# Patient Record
Sex: Female | Born: 1965
Health system: Southern US, Community
[De-identification: ages and names within clinical notes are randomized; demographics above are authoritative.]

## PROBLEM LIST (undated history)

## (undated) DIAGNOSIS — N6019 Diffuse cystic mastopathy of unspecified breast: Secondary | ICD-10-CM

## (undated) HISTORY — PX: ABLATION: SHX5711

## (undated) HISTORY — PX: BREAST BIOPSY: SHX20

## (undated) HISTORY — PX: TUBAL LIGATION: SHX77

---

## 2016-02-03 ENCOUNTER — Ambulatory Visit: Admit: 2016-02-03 | Payer: Self-pay

## 2016-02-03 ENCOUNTER — Ambulatory Visit: Admit: 2016-02-03 | Payer: Self-pay | Admitting: Ophthalmology

## 2016-02-03 SURGERY — BLEPHAROPLASTY
Anesthesia: IV Sedation (MBSC Only) | Laterality: Bilateral

## 2017-06-17 ENCOUNTER — Ambulatory Visit (INDEPENDENT_AMBULATORY_CARE_PROVIDER_SITE_OTHER): Payer: Managed Care, Other (non HMO) | Admitting: Family Medicine

## 2017-06-17 ENCOUNTER — Encounter: Payer: Self-pay | Admitting: Family Medicine

## 2017-06-17 VITALS — BP 134/82 | HR 100 | Temp 97.9°F | Ht 63.0 in | Wt 151.2 lb

## 2017-06-17 DIAGNOSIS — R82998 Other abnormal findings in urine: Secondary | ICD-10-CM

## 2017-06-17 DIAGNOSIS — M8580 Other specified disorders of bone density and structure, unspecified site: Secondary | ICD-10-CM

## 2017-06-17 DIAGNOSIS — N200 Calculus of kidney: Secondary | ICD-10-CM | POA: Diagnosis not present

## 2017-06-17 DIAGNOSIS — Z0000 Encounter for general adult medical examination without abnormal findings: Secondary | ICD-10-CM

## 2017-06-17 LAB — POCT URINALYSIS DIPSTICK
Bilirubin, UA: NEGATIVE
Glucose, UA: NEGATIVE
Ketones, UA: NEGATIVE
Nitrite, UA: POSITIVE
ODOR: ABNORMAL
PH UA: 6 (ref 5.0–8.0)
PROTEIN UA: NEGATIVE
Spec Grav, UA: 1.02 (ref 1.010–1.025)
UROBILINOGEN UA: 0.2 U/dL

## 2017-06-17 MED ORDER — NITROFURANTOIN MONOHYD MACRO 100 MG PO CAPS: 100.0000 mg | ORAL_CAPSULE | Freq: Two times a day (BID) | ORAL | 0 refills | Status: DC

## 2017-06-17 NOTE — Patient Instructions (Addendum)
We'll get labs today I do recommend yearly flu shots; for individuals who don't want flu shots, try to practice excellent hand hygiene, and avoid nursing homes, day cares, and hospitals during peak flu season; taking additional vitamin C daily during flu/cold season may help boost your immune system too Return in the next several weeks for a complete physical with pap smear   Kidney Stones Kidney stones (urolithiasis) are rock-like masses that form inside of the kidneys. Kidneys are organs that make pee (urine). A kidney stone can cause very bad pain and can block the flow of pee. The stone usually leaves your body (passes) through your pee. You may need to have a doctor take out the stone. Follow these instructions at home: Eating and drinking  Drink enough fluid to keep your pee clear or pale yellow. This will help you pass the stone.  If told by your doctor, change the foods you eat (your diet). This may include: ? Limiting how much salt (sodium) you eat. ? Eating more fruits and vegetables. ? Limiting how much meat, poultry, fish, and eggs you eat.  Follow instructions from your doctor about eating or drinking restrictions. General instructions  Collect pee samples as told by your doctor. You may need to collect a pee sample: ? 24 hours after a stone comes out. ? 8-12 weeks after a stone comes out, and every 6-12 months after that.  Strain your pee every time you pee (urinate), for as long as told. Use the strainer that your doctor recommends.  Do not throw out the stone. Keep it so that it can be tested by your doctor.  Take over-the-counter and prescription medicines only as told by your doctor.  Keep all follow-up visits as told by your doctor. This is important. You may need follow-up tests. Preventing kidney stones To prevent another kidney stone:  Drink enough fluid to keep your pee clear or pale yellow. This is the best way to prevent kidney stones.  Eat healthy  foods.  Avoid certain foods as told by your doctor. You may be told to eat less protein.  Stay at a healthy weight.  Contact a doctor if:  You have pain that gets worse or does not get better with medicine. Get help right away if:  You have a fever or chills.  You get very bad pain.  You get new pain in your belly (abdomen).  You pass out (faint).  You cannot pee. This information is not intended to replace advice given to you by your health care provider. Make sure you discuss any questions you have with your health care provider. Document Released: 10/20/2007 Document Revised: 01/20/2016 Document Reviewed: 01/20/2016 Elsevier Interactive Patient Education  2017 [employer].

## 2017-06-17 NOTE — Assessment & Plan Note (Signed)
May be related to body size, but will check vit D

## 2017-06-17 NOTE — Progress Notes (Signed)
BP 134/82 (BP Location: Right Arm, Patient Position: Sitting, Cuff Size: Normal)   Pulse 100   Temp 97.9 F (36.6 C) (Oral)   Ht 5\' 3"  (1.6 m)   Wt 151 lb 3.2 oz (68.6 kg)   SpO2 97%   BMI 26.78 kg/m    Subjective:    Patient ID: Lori Gray, female    DOB: 01-20-1966, 52 y.o.   MRN: [id number]  HPI: Lori Gray is a 52 y.o. female  Chief Complaint  Patient presents with  . 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, mostly sitting at work;  She gained weight after losing her father last April; 10 pounds; he lived to be 70; unusual stroke in the back of the brain; he was not a smoker Mother was a smoker; had RA; patient does not want any testing; does not want any more testing and has no sx  She would like to check her cholesterol; had a lot of coffee and donut (4+ hours ago) [facility]; her bone density looks low on the scans  Tetanus UTD; does not want flu shots  She had a kidney stone, passed it on the 19th of last month; same symptoms that her daughter had; treating with lemon H2O; maybe uncle (maternal) had them too; would like her urine checked today  Depression screen [facility] 2/9 06/17/2017  Decreased Interest 0  Down, Depressed, Hopeless 0  PHQ - 2 Score 0    Relevant past medical, surgical, family and social history reviewed History reviewed. No pertinent past medical history. Past Surgical History:  Procedure Laterality Date  . ABLATION    . TUBAL LIGATION     Family History  Problem Relation Age of Onset  . COPD Mother   . Cancer Mother        lung  . Depression Mother   . Arthritis Mother        rheumatoid  . Arthritis Father   . Cancer Father        unknown  . Diabetes Father   . Heart disease Father        CABG x 4  . Hyperlipidemia Father   . Hypertension Father   . Kidney disease Father   . Stroke Father 27  . Vision loss Father   . Macular degeneration Father          dry  . Cancer Brother    Social History   Tobacco Use  . Smoking status: Never Smoker  . Smokeless tobacco: Never Used  Substance Use Topics  . Alcohol use: Yes    Alcohol/week: 3.0 oz    Types: 5 Glasses of wine per week  . Drug use: No    Interim medical history since last visit reviewed. Allergies and medications reviewed  Review of Systems Per HPI unless specifically indicated above     Objective:    BP 134/82 (BP Location: Right Arm, Patient Position: Sitting, Cuff Size: Normal)   Pulse 100   Temp 97.9 F (36.6 C) (Oral)   Ht 5\' 3"  (1.6 m)   Wt 151 lb 3.2 oz (68.6 kg)   SpO2 97%   BMI 26.78 kg/m   Wt Readings from Last 3 Encounters:  06/17/17 151 lb 3.2 oz (68.6 kg)    Physical Exam  Constitutional: She appears well-developed and well-nourished. No distress.  Eyes: No scleral icterus.  Cardiovascular: Normal rate and regular rhythm.  Pulmonary/Chest: Effort normal and breath sounds normal.  Neurological: She is alert.  Psychiatric: She has a normal mood and affect.    Results for orders placed or performed in visit on 06/17/17  POCT Urinalysis Dipstick  Result Value Ref Range   Color, UA dark yellow    Clarity, UA cloudy    Glucose, UA neg    Bilirubin, UA neg    Ketones, UA neg    Spec Grav, UA 1.020 1.010 - 1.025   Blood, UA trace    pH, UA 6.0 5.0 - 8.0   Protein, UA neg    Urobilinogen, UA 0.2 0.2 or 1.0 E.U./dL   Nitrite, UA POSITIVE    Leukocytes, UA Large (3+) (A) Negative   Appearance cloudy    Odor abnormal       Assessment & Plan:   Problem List Items Addressed This Visit      Musculoskeletal and Integument   Osteopenia - Primary    May be related to body size, but will check vit D      Relevant Orders   VITAMIN D 25 Hydroxy (Vit-D Deficiency, Fractures)    Other Visit Diagnoses    Preventative health care       was getting paps at [facility], but needs that soon; will get labs and bring her back for physical   Relevant  Orders   CBC with Differential/Platelet   COMPLETE METABOLIC PANEL WITH GFR   Lipid panel   TSH   Kidney stone on right side       check urine; acidify urine with lemons (already doing it), hydrate   Relevant Orders   POCT Urinalysis Dipstick (Completed)   Leukocytes in urine       Relevant Orders   Urine Culture       Follow up plan: Return in about 4 weeks (around 07/15/2017) for complete physical.  An after-visit summary was printed and given to the patient at [facility].  Please see the patient instructions which may contain other information and recommendations beyond what is mentioned above in the assessment and plan.  Meds ordered this encounter  Medications  . nitrofurantoin, macrocrystal-monohydrate, (MACROBID) 100 MG capsule    Sig: Take 1 capsule (100 mg total) by mouth 2 (two) times daily.    Dispense:  10 capsule    Refill:  0    Orders Placed This Encounter  Procedures  . Urine Culture  . CBC with Differential/Platelet  . COMPLETE METABOLIC PANEL WITH GFR  . Lipid panel  . TSH  . VITAMIN D 25 Hydroxy (Vit-D Deficiency, Fractures)  . POCT Urinalysis Dipstick

## 2017-06-18 LAB — COMPLETE METABOLIC PANEL WITH GFR
AG Ratio: 1.5 (calc) (ref 1.0–2.5)
ALKALINE PHOSPHATASE (APISO): 70 U/L (ref 33–115)
ALT: 13 U/L (ref 6–29)
AST: 15 U/L (ref 10–35)
Albumin: 4.3 g/dL (ref 3.6–5.1)
BILIRUBIN TOTAL: 0.5 mg/dL (ref 0.2–1.2)
BUN: 10 mg/dL (ref 7–25)
CHLORIDE: 104 mmol/L (ref 98–110)
CO2: 26 mmol/L (ref 20–32)
Calcium: 9 mg/dL (ref 8.6–10.2)
Creat: 0.57 mg/dL (ref 0.50–1.10)
GFR, Est African American: 126 mL/min/{1.73_m2} (ref >=60)
GFR, Est Non African American: 109 mL/min/{1.73_m2} (ref >=60)
GLUCOSE: 84 mg/dL (ref 65–139)
Globulin: 2.8 g/dL (calc) (ref 1.9–3.7)
Potassium: 4 mmol/L (ref 3.5–5.3)
SODIUM: 139 mmol/L (ref 135–146)
Total Protein: 7.1 g/dL (ref 6.1–8.1)

## 2017-06-18 LAB — LIPID PANEL
CHOLESTEROL: 213 mg/dL — AB (ref <200)
HDL: 79 mg/dL (ref >50)
LDL CHOLESTEROL (CALC): 117 mg/dL — AB
Non-HDL Cholesterol (Calc): 134 mg/dL (calc) — ABNORMAL HIGH (ref <130)
Total CHOL/HDL Ratio: 2.7 (calc) (ref <5.0)
Triglycerides: 77 mg/dL (ref <150)

## 2017-06-18 LAB — CBC WITH DIFFERENTIAL/PLATELET
BASOS ABS: 77 {cells}/uL (ref 0–200)
Basophils Relative: 0.9 %
EOS PCT: 0.9 %
Eosinophils Absolute: 77 cells/uL (ref 15–500)
HEMATOCRIT: 39.9 % (ref 35.0–45.0)
Hemoglobin: 14.1 g/dL (ref 11.7–15.5)
LYMPHS ABS: 2554 {cells}/uL (ref 850–3900)
MCH: 30.7 pg (ref 27.0–33.0)
MCHC: 35.3 g/dL (ref 32.0–36.0)
MCV: 86.7 fL (ref 80.0–100.0)
MONOS PCT: 9.1 %
MPV: 10.7 fL (ref 7.5–12.5)
Neutro Abs: 5108 cells/uL (ref 1500–7800)
Neutrophils Relative %: 59.4 %
Platelets: 328 10*3/uL (ref 140–400)
RBC: 4.6 10*6/uL (ref 3.80–5.10)
RDW: 12.5 % (ref 11.0–15.0)
Total Lymphocyte: 29.7 %
WBC mixed population: 783 cells/uL (ref 200–950)
WBC: 8.6 10*3/uL (ref 3.8–10.8)

## 2017-06-18 LAB — VITAMIN D 25 HYDROXY (VIT D DEFICIENCY, FRACTURES): VIT D 25 HYDROXY: 19 ng/mL — AB (ref 30–100)

## 2017-06-18 LAB — TSH: TSH: 1.6 mIU/L

## 2017-06-20 ENCOUNTER — Other Ambulatory Visit: Payer: Self-pay | Admitting: Family Medicine

## 2017-06-20 ENCOUNTER — Telehealth: Payer: Self-pay

## 2017-06-20 LAB — URINE CULTURE
MICRO NUMBER:: 90140881
SPECIMEN QUALITY: ADEQUATE

## 2017-06-20 MED ORDER — VITAMIN D (ERGOCALCIFEROL) 1.25 MG (50000 UNIT) PO CAPS: 50000.0000 [IU] | ORAL_CAPSULE | ORAL | 0 refills | Status: AC

## 2017-06-20 NOTE — Progress Notes (Signed)
Add vit D weekly x 4 weeks, then 1000 iu daily

## 2017-06-20 NOTE — Telephone Encounter (Signed)
-----   Message from Arnetha Courser, MD sent at 06/20/2017  9:42 AM EST ----- Please let the patient know that the antibiotic should do the trick for this urinary tract infection; if any persistent symptoms after completion of therapy, come by for CMA visit to retest urine (POCT Urine dip) Her CBC is normal; CMP is normal; LDL higher than ideal, so try to limit fatty meats, saturated fats, cheese, egg yolks, and get more whole grains and fibers Her vitamin D is quite low, so I'll send in Rx for vit D once a week for 4 weeks, then just take 1,000 iu daily; thank you

## 2017-06-20 NOTE — Telephone Encounter (Signed)
Called pt, no answer. LM for pt informing her of information below per Dr.Lada. Also sent email to sign up for mychart. CRM created.

## 2017-06-21 ENCOUNTER — Telehealth: Payer: Self-pay | Admitting: Family Medicine

## 2017-06-21 NOTE — Telephone Encounter (Signed)
Copied from [place] [phone]. Topic: Quick Communication - Rx Refill/Question >> Jun 21, 2017  1:13 PM Scherrie Gerlach wrote: Medication: nitrofurantoin, macrocrystal-monohydrate, (MACROBID) 100 MG capsule  and Vitamin D, Ergocalciferol, (DRISDOL) 50000 units CAPS capsule  Pt reports having had a kidney stone 1/02 and 1/19 Therefore wants to know if she should wait to start the Vit D until after she finishes the macrobid?  Pt states one of the side effects of Vit D is kidney stones Please advise

## 2017-06-21 NOTE — Telephone Encounter (Signed)
That's fine

## 2017-06-21 NOTE — Telephone Encounter (Signed)
Called pt informed her that it was ok to hold off on vit d until she finishes Macrobid.

## 2017-06-22 ENCOUNTER — Encounter: Payer: Self-pay | Admitting: Family Medicine

## 2017-07-05 ENCOUNTER — Ambulatory Visit (INDEPENDENT_AMBULATORY_CARE_PROVIDER_SITE_OTHER): Payer: Managed Care, Other (non HMO) | Admitting: Family Medicine

## 2017-07-05 ENCOUNTER — Encounter: Payer: Self-pay | Admitting: Family Medicine

## 2017-07-05 VITALS — BP 108/64 | HR 76 | Temp 98.0°F | Ht 63.0 in | Wt 145.8 lb

## 2017-07-05 DIAGNOSIS — Z124 Encounter for screening for malignant neoplasm of cervix: Secondary | ICD-10-CM

## 2017-07-05 DIAGNOSIS — Z87440 Personal history of urinary (tract) infections: Secondary | ICD-10-CM

## 2017-07-05 DIAGNOSIS — Z0000 Encounter for general adult medical examination without abnormal findings: Secondary | ICD-10-CM | POA: Diagnosis not present

## 2017-07-05 DIAGNOSIS — Z1239 Encounter for other screening for malignant neoplasm of breast: Secondary | ICD-10-CM

## 2017-07-05 DIAGNOSIS — Z1231 Encounter for screening mammogram for malignant neoplasm of breast: Secondary | ICD-10-CM

## 2017-07-05 LAB — POCT URINALYSIS DIPSTICK
Appearance: NORMAL
Bilirubin, UA: NEGATIVE
Blood, UA: NEGATIVE
Glucose, UA: NEGATIVE
Ketones, UA: NEGATIVE
LEUKOCYTES UA: NEGATIVE
NITRITE UA: NEGATIVE
PROTEIN UA: NEGATIVE
Spec Grav, UA: 1.01 (ref 1.010–1.025)
Urobilinogen, UA: 0.2 E.U./dL
pH, UA: 7 (ref 5.0–8.0)

## 2017-07-05 NOTE — Patient Instructions (Addendum)
Consider getting the new shingles vaccine called Shingrix; that is available for individuals 50 years of age and older, and is recommended even if you have had shingles in the past and/or already received the old shingles vaccine (Zostavax); it is a two-part series, and is available at many local pharmacies  Health Maintenance, Female Adopting a healthy lifestyle and getting preventive care can go a long way to promote health and wellness. Talk with your health care provider about what schedule of regular examinations is right for you. This is a good chance for you to check in with your provider about disease prevention and staying healthy. In between checkups, there are plenty of things you can do on your own. Experts have done a lot of research about which lifestyle changes and preventive measures are most likely to keep you healthy. Ask your health care provider for more information. Weight and diet Eat a healthy diet  Be sure to include plenty of vegetables, fruits, low-fat dairy products, and lean protein.  Do not eat a lot of foods high in solid fats, added sugars, or salt.  Get regular exercise. This is one of the most important things you can do for your health. ? Most adults should exercise for at least 150 minutes each week. The exercise should increase your heart rate and make you sweat (moderate-intensity exercise). ? Most adults should also do strengthening exercises at least twice a week. This is in addition to the moderate-intensity exercise.  Maintain a healthy weight  Body mass index (BMI) is a measurement that can be used to identify possible weight problems. It estimates body fat based on height and weight. Your health care provider can help determine your BMI and help you achieve or maintain a healthy weight.  For females 76 years of age and older: ? A BMI below 18.5 is considered underweight. ? A BMI of 18.5 to 24.9 is normal. ? A BMI of 25 to 29.9 is considered  overweight. ? A BMI of 30 and above is considered obese.  Watch levels of cholesterol and blood lipids  You should start having your blood tested for lipids and cholesterol at 50 years of age, then have this test every 5 years.  You may need to have your cholesterol levels checked more often if: ? Your lipid or cholesterol levels are high. ? You are older than 50 years of age. ? You are at high risk for heart disease.  Cancer screening Lung Cancer  Lung cancer screening is recommended for adults 11-67 years old who are at high risk for lung cancer because of a history of smoking.  A yearly low-dose CT scan of the lungs is recommended for people who: ? Currently smoke. ? Have quit within the past 15 years. ? Have at least a 30-pack-year history of smoking. A pack year is smoking an average of one pack of cigarettes a day for 1 year.  Yearly screening should continue until it has been 15 years since you quit.  Yearly screening should stop if you develop a health problem that would prevent you from having lung cancer treatment.  Breast Cancer  Practice breast self-awareness. This means understanding how your breasts normally appear and feel.  It also means doing regular breast self-exams. Let your health care provider know about any changes, no matter how small.  If you are in your 20s or 30s, you should have a clinical breast exam (CBE) by a health care provider every 1-3 years as part  of a regular health exam.  If you are 30 or older, have a CBE every year. Also consider having a breast X-ray (mammogram) every year.  If you have a family history of breast cancer, talk to your health care provider about genetic screening.  If you are at high risk for breast cancer, talk to your health care provider about having an MRI and a mammogram every year.  Breast cancer gene (BRCA) assessment is recommended for women who have family members with BRCA-related cancers. BRCA-related cancers  include: ? Breast. ? Ovarian. ? Tubal. ? Peritoneal cancers.  Results of the assessment will determine the need for genetic counseling and BRCA1 and BRCA2 testing.  Cervical Cancer Your health care provider may recommend that you be screened regularly for cancer of the pelvic organs (ovaries, uterus, and vagina). This screening involves a pelvic examination, including checking for microscopic changes to the surface of your cervix (Pap test). You may be encouraged to have this screening done every 3 years, beginning at age 70.  For women ages 17-65, health care providers may recommend pelvic exams and Pap testing every 3 years, or they may recommend the Pap and pelvic exam, combined with testing for human papilloma virus (HPV), every 5 years. Some types of HPV increase your risk of cervical cancer. Testing for HPV may also be done on women of any age with unclear Pap test results.  Other health care providers may not recommend any screening for nonpregnant women who are considered low risk for pelvic cancer and who do not have symptoms. Ask your health care provider if a screening pelvic exam is right for you.  If you have had past treatment for cervical cancer or a condition that could lead to cancer, you need Pap tests and screening for cancer for at least 20 years after your treatment. If Pap tests have been discontinued, your risk factors (such as having a new sexual partner) need to be reassessed to determine if screening should resume. Some women have medical problems that increase the chance of getting cervical cancer. In these cases, your health care provider may recommend more frequent screening and Pap tests.  Colorectal Cancer  This type of cancer can be detected and often prevented.  Routine colorectal cancer screening usually begins at 50 years of age and continues through 50 years of age.  Your health care provider may recommend screening at an earlier age if you have risk factors  for colon cancer.  Your health care provider may also recommend using home test kits to check for hidden blood in the stool.  A small camera at the end of a tube can be used to examine your colon directly (sigmoidoscopy or colonoscopy). This is done to check for the earliest forms of colorectal cancer.  Routine screening usually begins at age 85.  Direct examination of the colon should be repeated every 5-10 years through 50 years of age. However, you may need to be screened more often if early forms of precancerous polyps or small growths are found.  Skin Cancer  Check your skin from head to toe regularly.  Tell your health care provider about any new moles or changes in moles, especially if there is a change in a mole's shape or color.  Also tell your health care provider if you have a mole that is larger than the size of a pencil eraser.  Always use sunscreen. Apply sunscreen liberally and repeatedly throughout the day.  Protect yourself by wearing long  sleeves, pants, a wide-brimmed hat, and sunglasses whenever you are outside.  Heart disease, diabetes, and high blood pressure  High blood pressure causes heart disease and increases the risk of stroke. High blood pressure is more likely to develop in: ? People who have blood pressure in the high end of the normal range (130-139/85-89 mm Hg). ? People who are overweight or obese. ? People who are African American.  If you are 93-79 years of age, have your blood pressure checked every 3-5 years. If you are 85 years of age or older, have your blood pressure checked every year. You should have your blood pressure measured twice-once when you are at a hospital or clinic, and once when you are not at a hospital or clinic. Record the average of the two measurements. To check your blood pressure when you are not at a hospital or clinic, you can use: ? An automated blood pressure machine at a pharmacy. ? A home blood pressure monitor.  If  you are between 74 years and 26 years old, ask your health care provider if you should take aspirin to prevent strokes.  Have regular diabetes screenings. This involves taking a blood sample to check your fasting blood sugar level. ? If you are at a normal weight and have a low risk for diabetes, have this test once every three years after 50 years of age. ? If you are overweight and have a high risk for diabetes, consider being tested at a younger age or more often. Preventing infection Hepatitis B  If you have a higher risk for hepatitis B, you should be screened for this virus. You are considered at high risk for hepatitis B if: ? You were born in a country where hepatitis B is common. Ask your health care provider which countries are considered high risk. ? Your parents were born in a high-risk country, and you have not been immunized against hepatitis B (hepatitis B vaccine). ? You have HIV or AIDS. ? You use needles to inject street drugs. ? You live with someone who has hepatitis B. ? You have had sex with someone who has hepatitis B. ? You get hemodialysis treatment. ? You take certain medicines for conditions, including cancer, organ transplantation, and autoimmune conditions.  Hepatitis C  Blood testing is recommended for: ? Everyone born from [birth year] through 1963. ? Anyone with known risk factors for hepatitis C.  Sexually transmitted infections (STIs)  You should be screened for sexually transmitted infections (STIs) including gonorrhea and chlamydia if: ? You are sexually active and are younger than 50 years of age. ? You are older than 50 years of age and your health care provider tells you that you are at risk for this type of infection. ? Your sexual activity has changed since you were last screened and you are at an increased risk for chlamydia or gonorrhea. Ask your health care provider if you are at risk.  If you do not have HIV, but are at risk, it may be recommended  that you take a prescription medicine daily to prevent HIV infection. This is called pre-exposure prophylaxis (PrEP). You are considered at risk if: ? You are sexually active and do not regularly use condoms or know the HIV status of your partner(s). ? You take drugs by injection. ? You are sexually active with a partner who has HIV.  Talk with your health care provider about whether you are at high risk of being infected with HIV. If  you choose to begin PrEP, you should first be tested for HIV. You should then be tested every 3 months for as long as you are taking PrEP. Pregnancy  If you are premenopausal and you may become pregnant, ask your health care provider about preconception counseling.  If you may become pregnant, take 400 to 800 micrograms (mcg) of folic acid every day.  If you want to prevent pregnancy, talk to your health care provider about birth control (contraception). Osteoporosis and menopause  Osteoporosis is a disease in which the bones lose minerals and strength with aging. This can result in serious bone fractures. Your risk for osteoporosis can be identified using a bone density scan.  If you are 51 years of age or older, or if you are at risk for osteoporosis and fractures, ask your health care provider if you should be screened.  Ask your health care provider whether you should take a calcium or vitamin D supplement to lower your risk for osteoporosis.  Menopause may have certain physical symptoms and risks.  Hormone replacement therapy may reduce some of these symptoms and risks. Talk to your health care provider about whether hormone replacement therapy is right for you. Follow these instructions at home:  Schedule regular health, dental, and eye exams.  Stay current with your immunizations.  Do not use any tobacco products including cigarettes, chewing tobacco, or electronic cigarettes.  If you are pregnant, do not drink alcohol.  If you are  breastfeeding, limit how much and how often you drink alcohol.  Limit alcohol intake to no more than 1 drink per day for nonpregnant women. One drink equals 12 ounces of beer, 5 ounces of wine, or 1 ounces of hard liquor.  Do not use street drugs.  Do not share needles.  Ask your health care provider for help if you need support or information about quitting drugs.  Tell your health care provider if you often feel depressed.  Tell your health care provider if you have ever been abused or do not feel safe at home. This information is not intended to replace advice given to you by your health care provider. Make sure you discuss any questions you have with your health care provider. Document Released: 11/16/2010 Document Revised: 10/09/2015 Document Reviewed: 02/04/2015 Elsevier Interactive Patient Education  [employer].

## 2017-07-05 NOTE — Progress Notes (Signed)
Patient ID: Lori Gray, female   DOB: [birth year], 52 y.o.   MRN: [id number]   Subjective:   Lori Gray is a 52 y.o. female here for a complete physical exam  Interim issues since last visit: no issues  She has had low vit D before  USPSTF grade A and B recommendations Depression:  Depression screen PHQ 2/9 06/17/2017  Decreased Interest 0  Down, Depressed, Hopeless 0  PHQ - 2 Score 0   Hypertension: much improved BP Readings from Last 3 Encounters:  07/05/17 108/64  06/17/17 134/82   Obesity: eating oatmeal, healthier, lower calorie intake, metamucil Wt Readings from Last 3 Encounters:  07/05/17 145 lb 12.8 oz (66.1 kg)  06/17/17 151 lb 3.2 oz (68.6 kg)   BMI Readings from Last 3 Encounters:  07/05/17 25.83 kg/m  06/17/17 26.78 kg/m     Skin cancer: nothing worrisiome Lung cancer:  nonsmoker Breast cancer: hx of fibrocystic disease; lots of lumps Colorectal cancer: start at 40, no fam hx Cervical cancer screening: today; hx of abnormal pap smear in 2013; then had every 6 months at the [place]; 3 nomrlal pap smears and then moved to routine screening BRCA gene screening: family hx of breast and/or ovarian cancer and/or metastatic prostate cancer? no HIV, hep B, hep C: declined STD testing and prevention (chl/gon/syphilis): declined Intimate partner violence: no abuse Contraception: ablation; LMP Tues 06/21/17 Osteoporosis: n/a Fall prevention/vitamin D: discussed Immunizations: f.u declined, tetanus UTD Diet: helathier eating Exercise: room for improvement, will walk at lunch when weather is better Alcohol: not more than 7 drinks per week Tobacco use: nonsmoker AAA: n/a Aspirin: n/a Glucose:  Glucose, Bld  Date Value Ref Range Status  06/17/2017 84 65 - 139 mg/dL Final    Comment:    .        Non-fasting reference interval .    Lipids:  Lab Results  Component Value Date   CHOL 213 (H) 06/17/2017   Lab Results  Component Value Date   HDL 79  06/17/2017   No results found for: [facility] Lab Results  Component Value Date   TRIG 77 06/17/2017   Lab Results  Component Value Date   CHOLHDL 2.7 06/17/2017   No results found for: LDLDIRECT   History reviewed. No pertinent past medical history. Past Surgical History:  Procedure Laterality Date  . ABLATION    . TUBAL LIGATION     Family History  Problem Relation Age of Onset  . COPD Mother   . Cancer Mother        lung  . Depression Mother   . Arthritis Mother        rheumatoid  . Arthritis Father   . Cancer Father        unknown  . Diabetes Father   . Heart disease Father        CABG x 4  . Hyperlipidemia Father   . Hypertension Father   . Kidney disease Father   . Stroke Father 29  . Vision loss Father   . Macular degeneration Father        dry  . Cancer Brother    Social History   Tobacco Use  . Smoking status: Never Smoker  . Smokeless tobacco: Never Used  Substance Use Topics  . Alcohol use: Yes    Alcohol/week: 3.0 oz    Types: 5 Glasses of wine per week  . Drug use: No   Review of Systems  Constitutional: Positive for fever (feels a  little elevated, but does not feel bad, check urine) and unexpected weight change (on purpose).  HENT: Positive for sinus pressure ([place]) and sinus pain.   Eyes: Negative for visual disturbance.  Respiratory: Negative for cough and wheezing.   Cardiovascular: Negative for chest pain and palpitations.  Endocrine: Negative for polydipsia.  Genitourinary:       Just passed kidney stone 07/12/2017; passed on urologist for now  Musculoskeletal: Negative for arthralgias (nothing serious).  Skin:       No worrisome moles to knowledge  Allergic/Immunologic: Negative for food allergies.  Neurological: Negative for tremors.  Hematological: Does not bruise/bleed easily.  Psychiatric/Behavioral: Negative for dysphoric mood.    Objective:   Vitals:   07/05/17 1553  BP: 108/64  Pulse: 76  Temp: 98 F (36.7 C)  TempSrc:  Oral  SpO2: 99%  Weight: 145 lb 12.8 oz (66.1 kg)  Height: _0  (1.6 m)  temp higher than usual 96 degrees  Body mass index is 25.83 kg/m. Wt Readings from Last 3 Encounters:  07/05/17 145 lb 12.8 oz (66.1 kg)  06/17/17 151 lb 3.2 oz (68.6 kg)   Physical Exam  Constitutional: She appears well-developed and well-nourished.  HENT:  Head: Normocephalic and atraumatic.  Eyes: Conjunctivae and EOM are normal. Right eye exhibits no hordeolum. Left eye exhibits no hordeolum. No scleral icterus.  Neck: Carotid bruit is not present. No thyromegaly present.  Cardiovascular: Normal rate, regular rhythm, S1 normal, S2 normal and normal heart sounds.  No extrasystoles are present.  Pulmonary/Chest: Effort normal and breath sounds normal. No respiratory distress. Right breast exhibits no inverted nipple, no mass, no nipple discharge, no skin change and no tenderness. Left breast exhibits no inverted nipple, no mass, no nipple discharge, no skin change and no tenderness. Breasts are symmetrical.  Lesion on LEFT breast superior aspect areola which appears c/w inclusion cyst; stable per patient  Abdominal: Soft. Normal appearance and bowel sounds are normal. She exhibits no distension, no abdominal bruit, no pulsatile midline mass and no mass. There is no hepatosplenomegaly. There is no tenderness. No hernia.  Genitourinary: Uterus normal. Pelvic exam was performed with patient prone. There is no rash or lesion on the right labia. There is no rash or lesion on the left labia. Cervix exhibits no motion tenderness. Right adnexum displays no mass, no tenderness and no fullness. Left adnexum displays no mass, no tenderness and no fullness.  Musculoskeletal: Normal range of motion. She exhibits no edema.  Lymphadenopathy:       Head (right side): No submandibular adenopathy present.       Head (left side): No submandibular adenopathy present.    She has no cervical adenopathy.    She has no axillary  adenopathy.  Neurological: She is alert. She displays no tremor. No cranial nerve deficit. She exhibits normal muscle tone. Gait normal.  Skin: Skin is warm and dry. No bruising and no ecchymosis noted. No cyanosis. No pallor.  Anterior LEFT thigh and medial LEFT calf, lesions c/w dermatofibroma  Psychiatric: Her speech is normal and behavior is normal. Thought content normal. Her mood appears not anxious. She does not exhibit a depressed mood.    Assessment/Plan:   Problem List Items Addressed This Visit      Other   Preventative health care - Primary    USPSTF grade A and B recommendations reviewed with patient; age-appropriate recommendations, preventive care, screening tests, etc discussed and encouraged; healthy living encouraged; see AVS for patient education given to  patient        Other Visit Diagnoses    Screening for breast cancer       Relevant Orders   MM DIGITAL SCREENING BILATERAL   Recent urinary tract infection       Relevant Orders   POCT urinalysis dipstick (Completed)   Encounter for Papanicolaou smear for cervical cancer screening       Relevant Orders   Pap IG and HPV (high risk) DNA detection       No orders of the defined types were placed in this encounter.  Orders Placed This Encounter  Procedures  . MM DIGITAL SCREENING BILATERAL    Standing Status:   Future    Standing Expiration Date:   09/03/2018    Order Specific Question:   Reason for Exam (SYMPTOM  OR DIAGNOSIS REQUIRED)    Answer:   Screening breast cancer    Order Specific Question:   Is the patient pregnant?    Answer:   No    Order Specific Question:   Preferred imaging location?    Answer:   [facility]  . POCT urinalysis dipstick    Follow up plan: Return in about 1 year (around 07/05/2018) for complete physical.  An After Visit Summary was printed and given to the patient.

## 2017-07-06 DIAGNOSIS — Z0000 Encounter for general adult medical examination without abnormal findings: Secondary | ICD-10-CM | POA: Insufficient documentation

## 2017-07-06 NOTE — Assessment & Plan Note (Signed)
USPSTF grade A and B recommendations reviewed with patient; age-appropriate recommendations, preventive care, screening tests, etc discussed and encouraged; healthy living encouraged; see AVS for patient education given to patient  

## 2017-07-11 LAB — PAP IG AND HPV HIGH-RISK: HPV DNA HIGH RISK: NOT DETECTED

## 2017-07-12 ENCOUNTER — Telehealth: Payer: Self-pay

## 2017-07-12 NOTE — Telephone Encounter (Signed)
-----   Message from Arnetha Courser, MD sent at 07/11/2017  5:12 PM EST ----- Please let pt know that her pap smear was adequate and did not show any abnormalities; her HPV was negative; great news; thank you

## 2017-07-12 NOTE — Telephone Encounter (Signed)
Called pt informed her of neg-neg PAP. Pt gave verbal understanding. Sent text for mychart sign up.

## 2018-02-20 ENCOUNTER — Telehealth: Payer: Self-pay | Admitting: Family Medicine

## 2018-02-20 DIAGNOSIS — Z1239 Encounter for other screening for malignant neoplasm of breast: Secondary | ICD-10-CM

## 2018-02-20 NOTE — Telephone Encounter (Signed)
Copied from [place] [id number]. Topic: Quick Communication - See Telephone Encounter >> Feb 20, 2018  3:34 PM Bea Graff, NT wrote: CRM for notification. See Telephone encounter for: 02/20/18. Pt would like an order to have a 3D mammogram done due to her breast are very dense.

## 2018-02-21 NOTE — Telephone Encounter (Signed)
That's fine; please order

## 2018-03-10 ENCOUNTER — Ambulatory Visit
Admission: RE | Admit: 2018-03-10 | Discharge: 2018-03-10 | Disposition: A | Discharge status: Home / Self Care | Payer: Managed Care, Other (non HMO) | Source: Ambulatory Visit | Attending: Family Medicine | Admitting: Family Medicine

## 2018-03-10 DIAGNOSIS — Z1239 Encounter for other screening for malignant neoplasm of breast: Secondary | ICD-10-CM | POA: Insufficient documentation

## 2018-03-10 HISTORY — DX: Diffuse cystic mastopathy of unspecified breast: N60.19

## 2018-03-28 ENCOUNTER — Other Ambulatory Visit: Payer: Self-pay | Admitting: Family Medicine

## 2018-03-28 DIAGNOSIS — N6320 Unspecified lump in the left breast, unspecified quadrant: Secondary | ICD-10-CM

## 2018-03-28 DIAGNOSIS — R928 Other abnormal and inconclusive findings on diagnostic imaging of breast: Secondary | ICD-10-CM

## 2018-03-28 DIAGNOSIS — N6310 Unspecified lump in the right breast, unspecified quadrant: Secondary | ICD-10-CM

## 2018-03-30 ENCOUNTER — Other Ambulatory Visit: Payer: Managed Care, Other (non HMO)

## 2018-03-30 ENCOUNTER — Ambulatory Visit: Payer: Managed Care, Other (non HMO)

## 2018-04-11 ENCOUNTER — Ambulatory Visit: Payer: Managed Care, Other (non HMO)

## 2018-04-11 ENCOUNTER — Other Ambulatory Visit: Payer: Managed Care, Other (non HMO)

## 2018-05-18 ENCOUNTER — Ambulatory Visit: Payer: Managed Care, Other (non HMO)

## 2018-05-18 ENCOUNTER — Other Ambulatory Visit: Payer: Managed Care, Other (non HMO)

## 2018-05-19 ENCOUNTER — Ambulatory Visit
Admission: RE | Admit: 2018-05-19 | Discharge: 2018-05-19 | Disposition: A | Discharge status: Home / Self Care | Payer: Managed Care, Other (non HMO) | Source: Ambulatory Visit | Attending: Family Medicine | Admitting: Family Medicine

## 2018-05-19 DIAGNOSIS — N6320 Unspecified lump in the left breast, unspecified quadrant: Secondary | ICD-10-CM

## 2018-05-19 DIAGNOSIS — N6310 Unspecified lump in the right breast, unspecified quadrant: Secondary | ICD-10-CM | POA: Diagnosis present

## 2018-05-19 DIAGNOSIS — R928 Other abnormal and inconclusive findings on diagnostic imaging of breast: Secondary | ICD-10-CM

## 2018-05-22 ENCOUNTER — Other Ambulatory Visit: Payer: Self-pay | Admitting: Family Medicine

## 2018-05-22 DIAGNOSIS — N6320 Unspecified lump in the left breast, unspecified quadrant: Secondary | ICD-10-CM

## 2018-05-22 DIAGNOSIS — R928 Other abnormal and inconclusive findings on diagnostic imaging of breast: Secondary | ICD-10-CM

## 2018-06-09 ENCOUNTER — Ambulatory Visit: Payer: Managed Care, Other (non HMO)

## 2018-06-20 ENCOUNTER — Ambulatory Visit
Admission: RE | Admit: 2018-06-20 | Discharge: 2018-06-20 | Disposition: A | Discharge status: Home / Self Care | Payer: Managed Care, Other (non HMO) | Source: Ambulatory Visit | Attending: Family Medicine | Admitting: Family Medicine

## 2018-06-20 DIAGNOSIS — R928 Other abnormal and inconclusive findings on diagnostic imaging of breast: Secondary | ICD-10-CM | POA: Diagnosis present

## 2018-06-20 DIAGNOSIS — N6320 Unspecified lump in the left breast, unspecified quadrant: Secondary | ICD-10-CM | POA: Diagnosis present

## 2018-06-20 HISTORY — PX: BREAST BIOPSY: SHX20

## 2018-06-21 LAB — SURGICAL PATHOLOGY

## 2018-06-22 ENCOUNTER — Telehealth: Payer: Self-pay | Admitting: Family Medicine

## 2018-06-22 NOTE — Telephone Encounter (Signed)
I reviewed the breast biopsy results Please encourage the patient to keep her appointment in February with me and we'll discuss these results further and talk about whether or not she wants to get established with a surgeon to follow along In the meantime, encourage her to perform monthly self-breast exams and notify us right of any changes in her breast health (size, skin texture, nipple retraction, discharge, lumps, etc.)  Thank you

## 2018-06-23 NOTE — Telephone Encounter (Signed)
Pt.notified

## 2018-07-07 ENCOUNTER — Telehealth: Payer: Self-pay

## 2018-07-07 ENCOUNTER — Encounter: Payer: Self-pay | Admitting: Family Medicine

## 2018-07-07 ENCOUNTER — Other Ambulatory Visit (HOSPITAL_COMMUNITY)
Admission: RE | Admit: 2018-07-07 | Discharge: 2018-07-07 | Disposition: A | Discharge status: Home / Self Care | Payer: Managed Care, Other (non HMO) | Source: Ambulatory Visit | Attending: Family Medicine | Admitting: Family Medicine

## 2018-07-07 ENCOUNTER — Ambulatory Visit: Payer: Managed Care, Other (non HMO)

## 2018-07-07 ENCOUNTER — Ambulatory Visit (INDEPENDENT_AMBULATORY_CARE_PROVIDER_SITE_OTHER): Payer: Managed Care, Other (non HMO) | Admitting: Family Medicine

## 2018-07-07 VITALS — BP 122/78 | HR 86 | Temp 98.1°F | Resp 12 | Ht 63.0 in | Wt 146.1 lb

## 2018-07-07 DIAGNOSIS — Z124 Encounter for screening for malignant neoplasm of cervix: Secondary | ICD-10-CM

## 2018-07-07 DIAGNOSIS — Z0000 Encounter for general adult medical examination without abnormal findings: Secondary | ICD-10-CM

## 2018-07-07 DIAGNOSIS — N898 Other specified noninflammatory disorders of vagina: Secondary | ICD-10-CM

## 2018-07-07 DIAGNOSIS — Z1211 Encounter for screening for malignant neoplasm of colon: Secondary | ICD-10-CM

## 2018-07-07 DIAGNOSIS — Z808 Family history of malignant neoplasm of other organs or systems: Secondary | ICD-10-CM | POA: Diagnosis not present

## 2018-07-07 DIAGNOSIS — N6459 Other signs and symptoms in breast: Secondary | ICD-10-CM

## 2018-07-07 DIAGNOSIS — E559 Vitamin D deficiency, unspecified: Secondary | ICD-10-CM

## 2018-07-07 DIAGNOSIS — M8580 Other specified disorders of bone density and structure, unspecified site: Secondary | ICD-10-CM

## 2018-07-07 DIAGNOSIS — Z1283 Encounter for screening for malignant neoplasm of skin: Secondary | ICD-10-CM | POA: Diagnosis not present

## 2018-07-07 MED ORDER — NYSTATIN-TRIAMCINOLONE 100000-0.1 UNIT/GM-% EX OINT: 1.0000 "application " | TOPICAL_OINTMENT | Freq: Two times a day (BID) | CUTANEOUS | 0 refills | Status: DC

## 2018-07-07 NOTE — Telephone Encounter (Signed)
Were you going to write rx for fungal infection under her breast?

## 2018-07-07 NOTE — Assessment & Plan Note (Signed)
Check level today 

## 2018-07-07 NOTE — Assessment & Plan Note (Signed)
USPSTF grade A and B recommendations reviewed with patient; age-appropriate recommendations, preventive care, screening tests, etc discussed and encouraged; healthy living encouraged; see AVS for patient education given to patient  

## 2018-07-07 NOTE — Progress Notes (Signed)
BP 122/78   Pulse 86   Temp 98.1 F (36.7 C) (Oral)   Resp 12   Ht _0  (1.6 m)   Wt 146 lb 1.6 oz (66.3 kg)   LMP 05/25/2018   SpO2 95%   BMI 25.88 kg/m    Subjective:    Patient ID: Lori Gray, female    DOB: [birth year], 53 y.o.   MRN: [id number]  HPI: Lori Gray is a 53 y.o. female  Chief Complaint  Patient presents with  . Annual Exam    with pap    HPI  USPSTF grade A and B recommendations Depression:  Depression screen [facility] 2/9 07/07/2018 06/17/2017  Decreased Interest 0 0  Down, Depressed, Hopeless 0 0  PHQ - 2 Score 0 0  Altered sleeping 0 -  Tired, decreased energy 0 -  Change in appetite 0 -  Feeling bad or failure about yourself  0 -  Trouble concentrating 0 -  Moving slowly or fidgety/restless 0 -  Suicidal thoughts 0 -  PHQ-9 Score 0 -  Difficult doing work/chores Not difficult at all -   Hypertension: BP Readings from Last 3 Encounters:  07/07/18 122/78  07/05/17 108/64  06/17/17 134/82   Obesity: Wt Readings from Last 3 Encounters:  07/07/18 146 lb 1.6 oz (66.3 kg)  07/05/17 145 lb 12.8 oz (66.1 kg)  06/17/17 151 lb 3.2 oz (68.6 kg)   BMI Readings from Last 3 Encounters:  07/07/18 25.88 kg/m  07/05/17 25.83 kg/m  06/17/17 26.78 kg/m     Skin cancer: brother has melanoma, father had melanoma Lung cancer:  nonsmoker Breast cancer: always lumpy and bumpy, nothing new; just had biopsied Colorectal cancer: she will do the colonoscopy Cervical cancer screening: pap smear today; hx of abnormal pap smear, maybe 7 years ago; just monitored, had cervical biopsy, it was "okay" BRCA gene screening: family hx of breast and/or ovarian cancer and/or metastatic prostate cancer? no  HIV, hep B, hep C: not interested STD testing and prevention (chl/gon/syphilis): has something in the pelvic areas, not sure what that is; no current vaginal discharge Intimate partner violence: no abuse Contraception: s/p BTL Osteoporosis: no steroids Fall  prevention/vitamin D: discussed; would like testing for vit D Immunizations: does not want flu shot or shingles vaccine; tetanus UTD Diet: healthy eater for the most part; real potatoes not instant potatoes, hardly any canned veggies; will try to increase fruit/veggie intake Exercise: nothing regular, more active job now Alcohol: total drinks per week it might be 3 per week, few beers; not more than 7 per week   Office Visit from 07/07/2018 in [facility]  AUDIT-C Score  3     Tobacco use: nonsmoker AAA: n/a Aspirin: does not qualify for daily aspirin therapy The 10-year ASCVD risk score Mikey Bussing DC Jr., et al., 2013) is: 0.8%   Values used to calculate the score:     Age: 67 years     Sex: Female     Is Non-Hispanic African American: No     Diabetic: No     Tobacco smoker: No     Systolic Blood Pressure: 638 mmHg     Is BP treated: No     HDL Cholesterol: 74 mg/dL     Total Cholesterol: 213 mg/dL  Glucose:  Glucose, Bld  Date Value Ref Range Status  07/07/2018 86 65 - 99 mg/dL Final    Comment:    .  Fasting reference interval .   06/17/2017 84 65 - 139 mg/dL Final    Comment:    .        Non-fasting reference interval .    Lipids:  Lab Results  Component Value Date   CHOL 213 (H) 07/07/2018   CHOL 213 (H) 06/17/2017   Lab Results  Component Value Date   HDL 74 07/07/2018   HDL 79 06/17/2017   Lab Results  Component Value Date   LDLCALC 125 (H) 07/07/2018   LDLCALC 117 (H) 06/17/2017   Lab Results  Component Value Date   TRIG 51 07/07/2018   TRIG 77 06/17/2017   Lab Results  Component Value Date   CHOLHDL 2.9 07/07/2018   CHOLHDL 2.7 06/17/2017   No results found for: LDLDIRECT   Depression screen [facility] 2/9 07/07/2018 06/17/2017  Decreased Interest 0 0  Down, Depressed, Hopeless 0 0  PHQ - 2 Score 0 0  Altered sleeping 0 -  Tired, decreased energy 0 -  Change in appetite 0 -  Feeling bad or failure about yourself  0 -    Trouble concentrating 0 -  Moving slowly or fidgety/restless 0 -  Suicidal thoughts 0 -  PHQ-9 Score 0 -  Difficult doing work/chores Not difficult at all -   Fall Risk  07/07/2018 06/17/2017  Falls in the past year? 0 No  Number falls in past yr: 1 -  Injury with Fall? 0 -    Relevant past medical, surgical, family and social history reviewed Past Medical History:  Diagnosis Date  . Fibrocystic breast    Past Surgical History:  Procedure Laterality Date  . ABLATION    . BREAST BIOPSY Left    benign  . BREAST BIOPSY Left    pending path, ribbon clip   . TUBAL LIGATION     Family History  Problem Relation Age of Onset  . COPD Mother   . Cancer Mother        lung  . Depression Mother   . Arthritis Mother        rheumatoid  . Arthritis Father   . Cancer Father        unknown  . Diabetes Father   . Heart disease Father        CABG x 4  . Hyperlipidemia Father   . Hypertension Father   . Kidney disease Father   . Stroke Father 74  . Vision loss Father   . Macular degeneration Father        dry  . Cancer Brother    Social History   Tobacco Use  . Smoking status: Never Smoker  . Smokeless tobacco: Never Used  Substance Use Topics  . Alcohol use: Yes    Alcohol/week: 5.0 standard drinks    Types: 5 Glasses of wine per week  . Drug use: No     Office Visit from 07/07/2018 in [facility]  AUDIT-C Score  3      Interim medical history since last visit reviewed. Allergies and medications reviewed  Review of Systems  Constitutional: Negative for unexpected weight change.  Respiratory: Negative for wheezing.   Cardiovascular: Negative for chest pain.  Gastrointestinal: Negative for blood in stool.  Endocrine: Negative for polydipsia.  Genitourinary: Negative for hematuria.  Hematological: Negative for adenopathy. Does not bruise/bleed easily.   Per HPI unless specifically indicated above     Objective:    BP 122/78   Pulse 86  Temp 98.1 F (36.7 C) (Oral)   Resp 12   Ht _0  (1.6 m)   Wt 146 lb 1.6 oz (66.3 kg)   LMP 05/25/2018   SpO2 95%   BMI 25.88 kg/m   Wt Readings from Last 3 Encounters:  07/07/18 146 lb 1.6 oz (66.3 kg)  07/05/17 145 lb 12.8 oz (66.1 kg)  06/17/17 151 lb 3.2 oz (68.6 kg)    Physical Exam Constitutional:      Appearance: Normal appearance. She is well-developed.  HENT:     Head: Normocephalic and atraumatic.  Eyes:     General: No scleral icterus.       Right eye: No hordeolum.        Left eye: No hordeolum.     Conjunctiva/sclera: Conjunctivae normal.  Neck:     Thyroid: No thyromegaly.     Vascular: No carotid bruit.  Cardiovascular:     Rate and Rhythm: Normal rate and regular rhythm.  No extrasystoles are present.    Heart sounds: Normal heart sounds, S1 normal and S2 normal.  Pulmonary:     Effort: Pulmonary effort is normal. No respiratory distress.     Breath sounds: Normal breath sounds.  Chest:     Breasts: Breasts are symmetrical.        Right: No inverted nipple, mass, nipple discharge, skin change or tenderness.        Left: No inverted nipple, mass, nipple discharge, skin change or tenderness.       Comments: Tender papule superior LEFT areola Abdominal:     General: Bowel sounds are normal. There is no distension or abdominal bruit.     Palpations: Abdomen is soft. There is no mass or pulsatile mass.     Tenderness: There is no abdominal tenderness.     Hernia: No hernia is present.  Genitourinary:    Exam position: Prone.     Labia:        Right: No rash or lesion.        Left: No rash or lesion.      Cervix: No cervical motion tenderness.     Adnexa:        Right: No mass, tenderness or fullness.         Left: No mass, tenderness or fullness.    Musculoskeletal: Normal range of motion.  Lymphadenopathy:     Head:     Right side of head: No submandibular adenopathy.     Left side of head: No submandibular adenopathy.     Cervical: No cervical  adenopathy.  Skin:    General: Skin is warm and dry.     Coloration: Skin is not pale.     Findings: No bruising or ecchymosis.  Neurological:     Mental Status: She is alert.     Cranial Nerves: No cranial nerve deficit.     Motor: No tremor or abnormal muscle tone.     Gait: Gait normal.  Psychiatric:        Mood and Affect: Mood is not anxious or depressed.        Speech: Speech normal.        Behavior: Behavior normal.        Thought Content: Thought content normal.       Assessment & Plan:   Problem List Items Addressed This Visit      Musculoskeletal and Integument   Osteopenia    Fall precautions, calcium and vit D  Other   Vitamin D deficiency    Check level today      Relevant Orders   VITAMIN D 25 Hydroxy (Vit-D Deficiency, Fractures) (Completed)   Preventative health care - Primary    USPSTF grade A and B recommendations reviewed with patient; age-appropriate recommendations, preventive care, screening tests, etc discussed and encouraged; healthy living encouraged; see AVS for patient education given to patient       Relevant Orders   CBC with Differential/Platelet (Completed)   COMPLETE METABOLIC PANEL WITH GFR (Completed)   Lipid panel (Completed)   TSH (Completed)    Other Visit Diagnoses    Screen for colon cancer       Relevant Orders   Ambulatory referral to Gastroenterology   Screening exam for skin cancer       Relevant Orders   Ambulatory referral to Dermatology   Family hx of melanoma       Relevant Orders   Ambulatory referral to Dermatology   Screening for cervical cancer       Relevant Orders   Cytology - PAP (Completed)   Vaginal discharge       Relevant Orders   Cervicovaginal ancillary only   Abnormal breast finding       Relevant Orders   Ambulatory referral to General Surgery       Follow up plan: Return in about 1 year (around 07/08/2019) for complete physical (or just after).  An after-visit summary was printed  and given to the patient at [place].  Please see the patient instructions which may contain other information and recommendations beyond what is mentioned above in the assessment and plan.  No orders of the defined types were placed in this encounter.   Orders Placed This Encounter  Procedures  . CBC with Differential/Platelet  . COMPLETE METABOLIC PANEL WITH GFR  . Lipid panel  . TSH  . VITAMIN D 25 Hydroxy (Vit-D Deficiency, Fractures)  . Ambulatory referral to Gastroenterology  . Ambulatory referral to Dermatology  . Ambulatory referral to General Surgery

## 2018-07-07 NOTE — Patient Instructions (Addendum)

## 2018-07-07 NOTE — Assessment & Plan Note (Signed)
Fall precautions, calcium and vit D

## 2018-07-08 LAB — COMPLETE METABOLIC PANEL WITH GFR
AG Ratio: 1.9 (calc) (ref 1.0–2.5)
ALT: 15 U/L (ref 6–29)
AST: 18 U/L (ref 10–35)
Albumin: 4.7 g/dL (ref 3.6–5.1)
Alkaline phosphatase (APISO): 73 U/L (ref 37–153)
BUN: 14 mg/dL (ref 7–25)
CO2: 28 mmol/L (ref 20–32)
Calcium: 9.3 mg/dL (ref 8.6–10.4)
Chloride: 105 mmol/L (ref 98–110)
Creat: 0.65 mg/dL (ref 0.50–1.05)
GFR, Est African American: 120 mL/min/{1.73_m2} (ref >=60)
GFR, Est Non African American: 104 mL/min/{1.73_m2} (ref >=60)
Globulin: 2.5 g/dL (calc) (ref 1.9–3.7)
Glucose, Bld: 86 mg/dL (ref 65–99)
Potassium: 3.9 mmol/L (ref 3.5–5.3)
Sodium: 141 mmol/L (ref 135–146)
Total Bilirubin: 0.5 mg/dL (ref 0.2–1.2)
Total Protein: 7.2 g/dL (ref 6.1–8.1)

## 2018-07-08 LAB — LIPID PANEL
Cholesterol: 213 mg/dL — ABNORMAL HIGH (ref <200)
HDL: 74 mg/dL (ref >=50)
LDL Cholesterol (Calc): 125 mg/dL (calc) — ABNORMAL HIGH
Non-HDL Cholesterol (Calc): 139 mg/dL (calc) — ABNORMAL HIGH (ref <130)
Total CHOL/HDL Ratio: 2.9 (calc) (ref <5.0)
Triglycerides: 51 mg/dL (ref <150)

## 2018-07-08 LAB — CBC WITH DIFFERENTIAL/PLATELET
Absolute Monocytes: 531 cells/uL (ref 200–950)
Basophils Absolute: 58 cells/uL (ref 0–200)
Basophils Relative: 0.9 %
Eosinophils Absolute: 58 cells/uL (ref 15–500)
Eosinophils Relative: 0.9 %
HCT: 41.6 % (ref 35.0–45.0)
Hemoglobin: 14.5 g/dL (ref 11.7–15.5)
Lymphs Abs: 2374 cells/uL (ref 850–3900)
MCH: 30.9 pg (ref 27.0–33.0)
MCHC: 34.9 g/dL (ref 32.0–36.0)
MCV: 88.5 fL (ref 80.0–100.0)
MPV: 10.9 fL (ref 7.5–12.5)
Monocytes Relative: 8.3 %
Neutro Abs: 3379 cells/uL (ref 1500–7800)
Neutrophils Relative %: 52.8 %
Platelets: 264 10*3/uL (ref 140–400)
RBC: 4.7 10*6/uL (ref 3.80–5.10)
RDW: 12.3 % (ref 11.0–15.0)
Total Lymphocyte: 37.1 %
WBC: 6.4 10*3/uL (ref 3.8–10.8)

## 2018-07-08 LAB — TSH: TSH: 1.72 mIU/L

## 2018-07-08 LAB — VITAMIN D 25 HYDROXY (VIT D DEFICIENCY, FRACTURES): Vit D, 25-Hydroxy: 17 ng/mL — ABNORMAL LOW (ref 30–100)

## 2018-07-09 ENCOUNTER — Other Ambulatory Visit: Payer: Self-pay | Admitting: Family Medicine

## 2018-07-09 MED ORDER — VITAMIN D (ERGOCALCIFEROL) 1.25 MG (50000 UNIT) PO CAPS: 50000.0000 [IU] | ORAL_CAPSULE | ORAL | 1 refills | Status: DC

## 2018-07-09 NOTE — Progress Notes (Signed)
Lori Gray, please let the patient know that her vitamin D is quite low; start Rx vitamin D once a week for 8 weeks, then just 1000 iu daily on days when she does not get much time outdoors and get sun exposure Her LDL cholesterol is a little higher than ideal, but not to the point of needing medicine; just limit saturated fats Other labs are within normal ranges  The 10-year ASCVD risk score [name]., et al., 2013) is: 0.8%   Values used to calculate the score:     Age: 53 years     Sex: Female     Is Non-Hispanic African American: No     Diabetic: No     Tobacco smoker: No     Systolic Blood Pressure: 259 mmHg     Is BP treated: No     HDL Cholesterol: 74 mg/dL     Total Cholesterol: 213 mg/dL

## 2018-07-09 NOTE — Progress Notes (Signed)
Start Rx vitamin D weekly x 8 weeks, then 1000 iu daily if not getting sun

## 2018-07-11 LAB — CYTOLOGY - PAP
CHLAMYDIA, DNA PROBE: NEGATIVE
DIAGNOSIS: NEGATIVE
HPV: NOT DETECTED
NEISSERIA GONORRHEA: NEGATIVE

## 2018-07-12 LAB — CERVICOVAGINAL ANCILLARY ONLY
Bacterial vaginitis: NEGATIVE
Candida vaginitis: NEGATIVE
TRICH (WINDOWPATH): NEGATIVE

## 2018-07-18 ENCOUNTER — Encounter: Payer: Self-pay | Admitting: *Deleted

## 2018-07-21 ENCOUNTER — Other Ambulatory Visit: Payer: Self-pay

## 2018-07-21 ENCOUNTER — Encounter: Payer: Self-pay | Admitting: Surgery

## 2018-07-21 ENCOUNTER — Ambulatory Visit (INDEPENDENT_AMBULATORY_CARE_PROVIDER_SITE_OTHER): Payer: Managed Care, Other (non HMO) | Admitting: Surgery

## 2018-07-21 VITALS — BP 143/88 | HR 70 | Temp 97.9°F | Resp 16 | Ht 62.0 in | Wt 146.2 lb

## 2018-07-21 DIAGNOSIS — N6082 Other benign mammary dysplasias of left breast: Secondary | ICD-10-CM | POA: Diagnosis not present

## 2018-07-21 NOTE — Patient Instructions (Addendum)
We have schedule you for a left breast cyst excision in office on 08/04/18 at 10:00 am.     Breast Cyst  A breast cyst is a sac in the breast that is filled with fluid. Breast cysts are usually noncancerous (benign). They are common among women, and they are most often located in the upper, outer portion of the breast. One or more cysts may develop. They form when fluid builds up inside of the breast glands. There are several types of breast cysts:  Macrocyst. This is a cyst that is about 2 inches (5.1 cm) across (in diameter).  Microcyst. This is a very small cyst that you cannot feel, but it can be seen with imaging tests such as an X-ray of the breast (mammogram) or ultrasound.  Galactocele. This is a cyst that contains milk. It may develop if you suddenly stop breastfeeding. Breast cysts do not increase your risk of breast cancer. They usually disappear after menopause, unless you take artificial hormones (are on hormone therapy). What are the causes? The exact cause of breast cysts is not known. Possible causes include:  Blockage of tubes (ducts) in the breast glands, which leads to fluid buildup. Duct blockage may result from: ? Fibrocystic breast changes. This is a common, benign condition that occurs when women go through hormonal changes during the menstrual cycle. This is a common cause of multiple breast cysts. ? Overgrowth of breast tissue or breast glands. ? Scar tissue in the breast from previous surgery.  Changes in certain female hormones (estrogen and progesterone). What increases the risk? You may be more likely to develop breast cysts if you have not gone through menopause. What are the signs or symptoms? Symptoms of a breast cyst may include:  Feeling one or more smooth, round, soft lumps (like grapes) in the breast that are easily moveable. The lump(s) may get bigger and more painful before your period and get smaller after your period.  Breast discomfort or  pain. How is this diagnosed? A cyst can be felt during a physical exam by your health care provider. A mammogram and ultrasound will be done to confirm the diagnosis. Fluid may be removed from the cyst with a needle (fine-needle aspiration) and tested to make sure the cyst is not cancerous. How is this treated? Treatment may not be necessary. Your health care provider may monitor the cyst to see if it goes away on its own. If the cyst is uncomfortable or gets bigger, or if you do not like how the cyst makes your breast look, you may need treatment. Treatment may include:  Hormone treatment.  Fine-needle aspiration, to drain fluid from the cyst. There is a chance of the cyst coming back (recurring) after aspiration.  Surgery to remove the cyst. Follow these instructions at home:  See your health care provider regularly. ? Get a yearly physical exam. ? If you are 74-64 years old, get a clinical breast exam every 1-3 years. After age 94, get this exam every year. ? Get mammograms as often as directed.  Do a breast self-exam every month, or as often as directed. Having many breast cysts, or "lumpy" breasts, may make it harder to feel for new lumps. Understand how your breasts normally look and feel, and write down any changes in your breasts so you can tell your health care provider about the changes. A breast self-exam involves: ? Comparing your breasts in the mirror. ? Looking for visible changes in your skin or nipples. ?  Feeling for lumps or changes.  Take over-the-counter and prescription medicines only as told by your health care provider.  Wear a supportive bra, especially when exercising.  Follow instructions from your health care provider about eating and drinking restrictions. ? Avoid caffeine. ? Cut down on salt (sodium) in what you eat and drink, especially before your menstrual period. Too much sodium can cause fluid buildup (retention), breast swelling, and discomfort.  Keep  all follow-up visits as told your health care provider. This is important. Contact a health care provider if:  You feel, or think you feel, a lump in your breast.  You notice that both breasts look or feel different than usual.  Your breast is still causing pain after your menstrual period is over.  You find new lumps or bumps that were not there before.  You feel lumps in your armpit (axilla). Get help right away if:  You have severe pain, tenderness, redness, or warmth in your breast.  You have fluid or blood leaking from your nipple.  Your breast lump becomes hard and painful.  You notice dimpling or wrinkling of the breast or nipple. This information is not intended to replace advice given to you by your health care provider. Make sure you discuss any questions you have with your health care provider. Document Released: 05/03/2005 Document Revised: 01/23/2016 Document Reviewed: 01/23/2016 Elsevier Interactive Patient Education  2019 [employer].

## 2018-07-21 NOTE — Progress Notes (Signed)
07/21/2018  Reason for Visit:  Left breast skin cyst  Referring Provider:  Enid Derry, MD.  History of Present Illness: Lori Gray is a 53 y.o. female presenting for evaluation of a cyst at the skin level of the left breast.  Coincidentally, the patient had a mammogram which showed a 9 mm mass in the left breast, which was biopsied on 06/20/18.  This resulted in pseudo-angiomatous stromal hyperplasia, apocrine metaplasia, usual ductal hyperplasia, sclerosing adenosis, and fibroadenomatous change.  It was negative for atypia and malignancy.  6 month follow up was recommended.  However the issue today is not with the biopsy and breast findings on mammogram.  She has had a cyst at the skin level on the left breast for many years.  It is located in the upper outer quadrant immediately adjacent to the areola.  She was told initially that if it did not bother her, there was nothing that needed to be done about it.  However, now it is bothering her.  She reports pain when something rubs against it.  Denies any drainage, erythema, or swelling of the cyst, but now that it is more symptomatic, she would like to have it removed.    Past Medical History: Past Medical History:  Diagnosis Date  . Fibrocystic breast      Past Surgical History: Past Surgical History:  Procedure Laterality Date  . ABLATION    . BREAST BIOPSY Left    benign  . BREAST BIOPSY Left    pending path, ribbon clip   . TUBAL LIGATION      Home Medications: Prior to Admission medications   Medication Sig Start Date End Date Taking? Authorizing Provider  nystatin-triamcinolone ointment (MYCOLOG) Apply 1 application topically 2 (two) times daily. 07/07/18  Yes Lada, Satira Anis, MD  Vitamin D, Ergocalciferol, (DRISDOL) 1.25 MG (50000 UT) CAPS capsule Take 1 capsule (50,000 Units total) by mouth every 7 (seven) days. 07/09/18  Yes Lada, Satira Anis, MD    Allergies: No Known Allergies  Social History:  reports that she has  never smoked. She has never used smokeless tobacco. She reports current alcohol use of about 5.0 standard drinks of alcohol per week. She reports that she does not use drugs.   Family History: Family History  Problem Relation Age of Onset  . COPD Mother   . Cancer Mother        lung  . Depression Mother   . Arthritis Mother        rheumatoid  . Arthritis Father   . Cancer Father        unknown  . Diabetes Father   . Heart disease Father        CABG x 4  . Hyperlipidemia Father   . Hypertension Father   . Kidney disease Father   . Stroke Father 77  . Vision loss Father   . Macular degeneration Father        dry  . Cancer Brother     Review of Systems: Review of Systems  Constitutional: Negative for chills and fever.  HENT: Negative for hearing loss.   Eyes: Negative for blurred vision.  Respiratory: Negative for shortness of breath.   Cardiovascular: Negative for chest pain.  Gastrointestinal: Negative for abdominal pain, nausea and vomiting.  Genitourinary: Negative for dysuria.  Musculoskeletal: Negative for myalgias.  Skin:       Left breast skin cyst  Neurological: Negative for dizziness.  Psychiatric/Behavioral: Negative for depression.    Physical  Exam BP (!) 143/88   Pulse 70   Temp 97.9 F (36.6 C) (Temporal)   Resp 16   Ht 5\' 2"  (1.575 m)   Wt 146 lb 3.2 oz (66.3 kg)   SpO2 98%   BMI 26.74 kg/m  CONSTITUTIONAL: No acute distress HEENT:  Normocephalic, atraumatic, extraocular motion intact. NECK: Trachea is midline, and there is no jugular venous distension.  RESPIRATORY:  Lungs are clear, and breath sounds are equal bilaterally. Normal respiratory effort without pathologic use of accessory muscles. CARDIOVASCULAR: Heart is regular without murmurs, gallops, or rubs. BREAST:  Left breast has a skin cyst at the 2 o clock point of the areolar edge.  It measures about 7 mm in size, appears superficial and can touch tip of fingers under the cyst.  There  are no palpable masses on left breast, no axillary lymphadenopathy, and no drainage.  Right breast not examined. GI: The abdomen is soft, nondistended, nontender.  MUSCULOSKELETAL:  Normal muscle strength and tone in all four extremities.  No peripheral edema or cyanosis. SKIN: Skin turgor is normal. There are no pathologic skin lesions.  NEUROLOGIC:  Motor and sensation is grossly normal.  Cranial nerves are grossly intact. PSYCH:  Alert and oriented to person, place and time. Affect is normal.  Laboratory Analysis: Left breast biopsy 06/20/18: BREAST MASS, LEFT 12:00 5 CM FN; ULTRASOUND-GUIDED BIOPSY:  - BENIGN BREAST TISSUE WITH PSEUDO-ANGIOMATOUS STROMAL HYPERPLASIA,  APOCRINE METAPLASIA, USUAL DUCTAL HYPERPLASIA, SCLEROSING ADENOSIS, AND  PARTIAL SAMPLING OF FIBROADENOMATOUS CHANGE, SEE COMMENT.  - NEGATIVE FOR ATYPIA AND MALIGNANCY.  Imaging: Mammogram 05/22/18: FINDINGS: Mammographically, there is a low-density circumscribed mass in the left breast slightly upper inner quadrant, middle depth. There is a benign calcified mass in the left breast slightly upper outer quadrant, middle depth. In the right breast, there is a persistent circumscribed less than 1 cm low-density mass, in the slightly upper inner quadrant, middle depth.  Mammographic images were processed with CAD.  On physical exam, no suspicious masses are palpated.  Targeted right breast ultrasound is performed, showing no suspicious masses or shadowing lesions. Mild symmetric duct ectasia is noted, which may account for the mammographic finding.  Targeted left breast ultrasound is performed, showing a benign-appearing complicated cyst or cluster of cysts in the left breast 11:30 o'clock 3 cm from the nipple measuring 0.8 x 0.5 x 0.7 cm. This finding likely corresponds to the mammographically seen benign-appearing mass. In the left 12 o'clock breast 5 cm from the nipple there is a hypoechoic slightly irregular mass which  measures 0.7 x 0.6 x 0.9 cm.  IMPRESSION: No mammographic or sonographic evidence of malignancy in the right breast.  Left breast 12 o'clock 5 cm from the nipple indeterminate 9 mm mass, for which ultrasound-guided core needle biopsy is recommended.  RECOMMENDATION: Ultrasound-guided core needle biopsy of the left breast.  Assessment and Plan: This is a 53 y.o. female with a left breast skin cyst.  Discussed with the patient the pathology results from her prior breast biopsy and she is in agreement that there is no resection needed and she will follow up as recommended by radiology in 6 months to assess the mass in the left breast.  Regarding her skin cyst, discussed with the patient that I would be happy to help with her symptoms and resect it.  I think based on size and location, it is amenable to in-office procedure for resection.  Discussed with the patient that we would inject numbing medication around the  cyst and excise it and close the wound with sutures under the skin.  Discussed with her the risks of bleeding, infection, and injury to surrounding structures.  She is willing to proceed.  We will schedule her for 08/04/18.  She has plans for a trip on April 17th, which is about 4 weeks after the procedure.  I do not see any contraindications at this point for her trip, as the wound should be fully healed by then.  Face-to-face time spent with the patient and care providers was 60 minutes, with more than 50% of the time spent counseling, educating, and coordinating care of the patient.     Melvyn Neth, [facility]

## 2018-08-04 ENCOUNTER — Ambulatory Visit (INDEPENDENT_AMBULATORY_CARE_PROVIDER_SITE_OTHER): Payer: Managed Care, Other (non HMO) | Admitting: Surgery

## 2018-08-04 ENCOUNTER — Other Ambulatory Visit: Payer: Self-pay

## 2018-08-04 ENCOUNTER — Encounter: Payer: Self-pay | Admitting: Surgery

## 2018-08-04 VITALS — BP 137/81 | HR 86 | Temp 97.7°F | Resp 16 | Ht 62.0 in | Wt 143.0 lb

## 2018-08-04 DIAGNOSIS — N6082 Other benign mammary dysplasias of left breast: Secondary | ICD-10-CM

## 2018-08-04 NOTE — Progress Notes (Signed)
  Procedure Date:  08/04/2018  Pre-operative Diagnosis:  Left breast skin cyst  Post-operative Diagnosis:  Left breast skin cyst  Procedure:  Excision of left breast skin cyst  Surgeon:  Melvyn Neth, MD  Anesthesia:  4 ml 1% lidocaine with epi  Estimated Blood Loss:  2 ml  Specimens:  Left breast skin cyst  Complications:  None  Indications for Procedure:  This is a 53 y.o. female with diagnosis of a symptomatic left breast skin cyst.  The patient wishes to have this excised. The risks of bleeding, abscess or infection, injury to surrounding structures, and need for further procedures were all discussed with the patient and she was willing to proceed.  Description of Procedure: The patient was correctly identified at bedside.  The patient was placed supine.  Appropriate time-outs were performed.   The patient's left breast was prepped and draped in usual sterile fashion.  Local anesthetic was infused intradermally.  An elliptical 2 cm incision was made over the cyst, at the upper outer edge of the areola.  Sharp dissection was done through the dermis to subcutaneous tissue.  The cyst with skin was removed sharply, intact.  Skin flaps were created.    The wound was then closed in two layers using 3-0 Vicryl and 4-0 Monocryl.  There was great hemostasis.  The incision was cleaned and sealed with DermaBond.  The patient tolerated the procedure well and all sharps were appropriately disposed of at the end of the case.   --Instructed patient that she may apply dry gauze dressing over incision for padding if needed. --May shower tomorrow --Follow up in two weeks for wound check.   Melvyn Neth, MD

## 2018-08-04 NOTE — Patient Instructions (Addendum)
Patient will need to return to the office in 2 weeks please keep the area dry and clean for 2 days (No showering for the next 48 hours). Use ibuprofen for pain.   Call the office with any questions or concerns.

## 2018-08-16 ENCOUNTER — Other Ambulatory Visit: Payer: Self-pay

## 2018-08-16 ENCOUNTER — Encounter: Payer: Self-pay | Admitting: Surgery

## 2018-08-16 ENCOUNTER — Ambulatory Visit (INDEPENDENT_AMBULATORY_CARE_PROVIDER_SITE_OTHER): Payer: Managed Care, Other (non HMO) | Admitting: Surgery

## 2018-08-16 DIAGNOSIS — N6082 Other benign mammary dysplasias of left breast: Secondary | ICD-10-CM | POA: Diagnosis not present

## 2018-08-16 DIAGNOSIS — Z09 Encounter for follow-up examination after completed treatment for conditions other than malignant neoplasm: Secondary | ICD-10-CM | POA: Diagnosis not present

## 2018-08-16 NOTE — Progress Notes (Signed)
Virtual Visit via Telephone Note  I connected with Lori Gray on 08/16/18 at 10:30 AM EDT by telephone and verified that I am speaking with the correct person using two identifiers.   I discussed the limitations, risks, security and privacy concerns of performing an evaluation and management service by telephone and the availability of in person appointments. I also discussed with the patient that there may be a patient responsible charge related to this service. The patient expressed understanding and agreed to proceed.  This service was provided via telemedicine.  The patient consented to the visit being carried via telemedicine.  Patient's location:  Work  Provider's location:  Office  Referring Provider:  Enid Derry, MD  People participating in this telemedicine visit:  Patient, myself  Time spent:  15 minutes   History of Present Illness: 53 yo female s/p excision of left breast skin cyst on 08/04/18.  We had initially set up a FaceTime appointment with the patient for virtual face-to-face, but she is currently at work and called into the office instead.  Patient reported that she is been doing well after the procedure and has noticed only some bruising around the incision itself.  There was a little bit of bleeding on the left side of the scar on the first day but otherwise has been doing well with no drainage.   Observations/Objective: Does not appear in any acute distress and able to carry a conversation without any increased work of breathing  Assessment and Plan: 53 year old female status post excision of left breast skin cyst  -Reviewed pathology with the patient.  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  Discussed with the patient that although an unusual type of mass to find on the skin of the breast, this is a benign finding.  She describes that she has had this mass for at least 15 years and does not notice any other masses like that throughout her body.   This is likely to be more of single mass rather than multiple leiomyomas of the skin which could point towards a genetic disease.  At this time I am not worried about anything like that and this is most likely a single finding which is benign in nature. - The patient continues to heal well and there are no worries at this point for any evidence of infection or wound breakdown. -Patient will follow-up as needed.  Follow Up Instructions:    I discussed the assessment and treatment plan with the patient. The patient was provided an opportunity to ask questions and all were answered. The patient agreed with the plan and demonstrated an understanding of the instructions.   The patient was advised to call back or seek an in-person evaluation if the symptoms worsen or if the condition fails to improve as anticipated.  I provided 15 minutes of non-face-to-face time during this encounter.   Olean Ree, MD

## 2018-08-18 ENCOUNTER — Ambulatory Visit: Payer: Managed Care, Other (non HMO) | Admitting: Surgery

## 2018-08-25 ENCOUNTER — Ambulatory Visit: Payer: Managed Care, Other (non HMO) | Admitting: Surgery

## 2018-08-31 ENCOUNTER — Ambulatory Visit: Payer: Self-pay

## 2018-08-31 NOTE — Telephone Encounter (Signed)
Pt c/o mild dizziness and vertigo since Tuesday. Pt stated that she has had these symptoms before and took Sudafed and the symptoms went away. Pt stated the sudafed is not working.  Care advice given and pt verbalized understanding. Call transferred to the office.        Reason for Disposition . [1] MILD dizziness (e.g., vertigo; walking normally) AND [2] has NOT been evaluated by physician for this  Answer Assessment - Initial Assessment Questions 1. DESCRIPTION: "Describe your dizziness."     Equilibrium is off  2. VERTIGO: "Do you feel like either you or the room is spinning or tilting?"      Pt is tilting 3. LIGHTHEADED: "Do you feel lightheaded?" (e.g., somewhat faint, woozy, weak upon standing)     yes 4. SEVERITY: "How bad is it?"  "Can you walk?"   - MILD - Feels unsteady but walking normally.   - MODERATE - Feels very unsteady when walking, but not falling; interferes with normal activities (e.g., school, work) .   - SEVERE - Unable to walk without falling (requires assistance).     mild 5. ONSET:  "When did the dizziness begin?"     Tuesday 6. AGGRAVATING FACTORS: "Does anything make it worse?" (e.g., standing, change in head position)     Standing up 7. CAUSE: "What do you think is causing the dizziness?"     allergies 8. RECURRENT SYMPTOM: "Have you had dizziness before?" If so, ask: "When was the last time?" "What happened that time?"     Yes-5 years took Sudafed 2 in the am and 2 6 hours later not helping, hot shower, hearing pad and cold to head 9. OTHER SYMPTOMS: "Do you have any other symptoms?" (e.g., headache, weakness, numbness, vomiting, earache)     no 10. PREGNANCY: "Is there any chance you are pregnant?" "When was your last menstrual period?"       No- LMP: last January  Protocols used: DIZZINESS - VERTIGO-A-AH

## 2018-08-31 NOTE — Telephone Encounter (Signed)
Left detailed VM advising patient to schedule virtual visit. CRM created.

## 2018-08-31 NOTE — Telephone Encounter (Signed)
So this patient really needs a visit; either MyChart or e-visit or telehealth visit This is not really something I'm just going to do over the phone if it's a new problem

## 2018-09-01 ENCOUNTER — Ambulatory Visit (INDEPENDENT_AMBULATORY_CARE_PROVIDER_SITE_OTHER): Payer: Managed Care, Other (non HMO) | Admitting: Nurse Practitioner

## 2018-09-01 ENCOUNTER — Telehealth: Payer: Self-pay | Admitting: *Deleted

## 2018-09-01 ENCOUNTER — Other Ambulatory Visit: Payer: Self-pay

## 2018-09-01 ENCOUNTER — Encounter: Payer: Self-pay | Admitting: Nurse Practitioner

## 2018-09-01 VITALS — Temp 97.6°F | Ht 62.0 in | Wt 141.0 lb

## 2018-09-01 DIAGNOSIS — J0100 Acute maxillary sinusitis, unspecified: Secondary | ICD-10-CM | POA: Diagnosis not present

## 2018-09-01 DIAGNOSIS — J302 Other seasonal allergic rhinitis: Secondary | ICD-10-CM

## 2018-09-01 DIAGNOSIS — R0981 Nasal congestion: Secondary | ICD-10-CM | POA: Diagnosis not present

## 2018-09-01 MED ORDER — LORATADINE 10 MG PO TABS: 10.0000 mg | ORAL_TABLET | Freq: Every day | ORAL | 2 refills | Status: DC

## 2018-09-01 MED ORDER — FLUTICASONE PROPIONATE 50 MCG/ACT NA SUSP: 2.0000 | Freq: Every day | NASAL | 2 refills | Status: DC

## 2018-09-01 NOTE — Progress Notes (Signed)
Virtual Visit via Telephone Note  I connected with Lori Gray on 09/01/18 at 10:20 AM EDT by a video enabled telemedicine application and verified that I am speaking with the correct person using two identifiers.   Staff discussed the limitations of evaluation and management by telemedicine and the availability of in person appointments. The patient expressed understanding and agreed to proceed.  Patient location: home  My location: home office Other people present:  None, husband phone call.   HPI  Patient notes 2 days ago went outside and allergies triggered has lots of nasal congestion and facial pressure in cheeks. States she has been taking sudafed with temporary relief. Has also started using vicks vapor rub and massaging face but not improving much.   Right side more tender in maxillary and frontal area with self palpation.   No fever, chills, rhinorrhea, sore throat, cough.  She can still smell and taste.  PHQ2/9: Depression screen [facility] 2/9 09/01/2018 07/07/2018 06/17/2017  Decreased Interest 0 0 0  Down, Depressed, Hopeless 0 0 0  PHQ - 2 Score 0 0 0  Altered sleeping 0 0 -  Tired, decreased energy 0 0 -  Change in appetite 0 0 -  Feeling bad or failure about yourself  0 0 -  Trouble concentrating 0 0 -  Moving slowly or fidgety/restless 0 0 -  Suicidal thoughts 0 0 -  PHQ-9 Score 0 0 -  Difficult doing work/chores Not difficult at all Not difficult at all -    PHQ reviewed. Negative  Patient Active Problem List   Diagnosis Date Noted  . Cyst of skin of breast, left 07/21/2018  . Vitamin D deficiency 07/07/2018  . Preventative health care 07/06/2017  . Osteopenia 06/17/2017    Past Medical History:  Diagnosis Date  . Fibrocystic breast     Past Surgical History:  Procedure Laterality Date  . ABLATION    . BREAST BIOPSY Left    benign  . BREAST BIOPSY Left    pending path, ribbon clip   . TUBAL LIGATION      Social History   Tobacco Use  .  Smoking status: Never Smoker  . Smokeless tobacco: Never Used  Substance Use Topics  . Alcohol use: Yes    Alcohol/week: 5.0 standard drinks    Types: 5 Glasses of wine per week     Current Outpatient Medications:  .  ibuprofen (ADVIL) 200 MG tablet, Take 200 mg by mouth every 6 (six) hours as needed., Disp: , Rfl:  .  nystatin-triamcinolone ointment (MYCOLOG), Apply 1 application topically 2 (two) times daily., Disp: 30 g, Rfl: 0 .  phenylephrine (SUDAFED PE) 10 MG TABS tablet, Take 10 mg by mouth every 4 (four) hours as needed., Disp: , Rfl:  .  Vitamin D, Ergocalciferol, (DRISDOL) 1.25 MG (50000 UT) CAPS capsule, Take 1 capsule (50,000 Units total) by mouth every 7 (seven) days., Disp: 4 capsule, Rfl: 1  No Known Allergies  ROS  No other specific complaints in a complete review of systems (except as listed in HPI above).  Objective  Vitals:   09/01/18 1009  Temp: 97.6 F (36.4 C)  TempSrc: Oral  Weight: 141 lb (64 kg)  Height: 5\' 2"  (1.575 m)   Body mass index is 25.79 kg/m.  Nursing Note and Vital Signs reviewed.  Physical Exam Alert, able to speak in full sentences   Assessment & Plan  1. Seasonal allergies - loratadine (CLARITIN) 10 MG tablet; Take 1 tablet (10  mg total) by mouth daily.  Dispense: 30 tablet; Refill: 2 - fluticasone (FLONASE) 50 MCG/ACT nasal spray; Place 2 sprays into both nostrils daily.  Dispense: 16 g; Refill: 2  2. Nasal congestion - loratadine (CLARITIN) 10 MG tablet; Take 1 tablet (10 mg total) by mouth daily.  Dispense: 30 tablet; Refill: 2 - fluticasone (FLONASE) 50 MCG/ACT nasal spray; Place 2 sprays into both nostrils daily.  Dispense: 16 g; Refill: 2  3. Acute non-recurrent maxillary sinusitis Discussed s&s for bacterial infection will call back if unimproved in 8 days, acute worsening or fevers - loratadine (CLARITIN) 10 MG tablet; Take 1 tablet (10 mg total) by mouth daily.  Dispense: 30 tablet; Refill: 2 - fluticasone (FLONASE)  50 MCG/ACT nasal spray; Place 2 sprays into both nostrils daily.  Dispense: 16 g; Refill: 2     Follow Up Instructions:   PRN I discussed the assessment and treatment plan with the patient. The patient was provided an opportunity to ask questions and all were answered. The patient agreed with the plan and demonstrated an understanding of the instructions.   The patient was advised to call back or seek an in-person evaluation if the symptoms worsen or if the condition fails to improve as anticipated.  I provided 13 minutes of non-face-to-face time during this encounter.   Fredderick Severance, NP

## 2018-09-01 NOTE — Telephone Encounter (Signed)
Patient called the office wanting to confirm the name of what Dr. Hampton Abbot said was excised off her breast. The patient was provided with the information-leiomyoma.   The patient states Dr. Hampton Abbot had also wanted to know if she had any more of these areas on her body and at the time of the call patient said she did not think of any. Today she reports one on her face and her leg.   Dr. Hampton Abbot informed and would like to see patient in the office once COVID-19 restrictions have been lifted for further evaluation if areas are stable in size. If area starts growing rapidly, patient will need to call the office for earlier evaluation.  Patient states she got a part time job and does not have a lot of free time.   The patient wishes to have an appointment in September 2020. She was placed in recalls accordingly.

## 2018-10-17 ENCOUNTER — Ambulatory Visit (INDEPENDENT_AMBULATORY_CARE_PROVIDER_SITE_OTHER): Payer: Managed Care, Other (non HMO) | Admitting: Nurse Practitioner

## 2018-10-17 ENCOUNTER — Encounter: Payer: Self-pay | Admitting: Nurse Practitioner

## 2018-10-17 ENCOUNTER — Other Ambulatory Visit: Payer: Self-pay

## 2018-10-17 ENCOUNTER — Ambulatory Visit
Admission: RE | Admit: 2018-10-17 | Discharge: 2018-10-17 | Disposition: A | Discharge status: Home / Self Care | Payer: Managed Care, Other (non HMO) | Source: Ambulatory Visit | Attending: Nurse Practitioner | Admitting: Nurse Practitioner

## 2018-10-17 VITALS — BP 118/70 | HR 91 | Temp 98.1°F | Resp 14 | Ht 62.0 in | Wt 142.3 lb

## 2018-10-17 DIAGNOSIS — E559 Vitamin D deficiency, unspecified: Secondary | ICD-10-CM

## 2018-10-17 DIAGNOSIS — M79642 Pain in left hand: Secondary | ICD-10-CM | POA: Insufficient documentation

## 2018-10-17 DIAGNOSIS — M25641 Stiffness of right hand, not elsewhere classified: Secondary | ICD-10-CM

## 2018-10-17 DIAGNOSIS — M25642 Stiffness of left hand, not elsewhere classified: Secondary | ICD-10-CM

## 2018-10-17 DIAGNOSIS — N644 Mastodynia: Secondary | ICD-10-CM

## 2018-10-17 DIAGNOSIS — M79641 Pain in right hand: Secondary | ICD-10-CM

## 2018-10-17 NOTE — Progress Notes (Signed)
Name: Lori Gray   MRN: [id number]    DOB: 11-25-1965   Date:10/17/2018       Progress Note  Subjective  Chief Complaint  Chief Complaint  Patient presents with  . Hand Pain    Biltateral, onset 2 weeks ago.  . Breast Problem    Left. Biopsy in February, issue with incision.     HPI  Patient endorses bilateral hand pain started some time in the last year states feels it has progressively gotten worse. Feels like her joints are stiff and tender. Endorses weaker grip in both hands. Mother had rheumatoid arthritis. She has always worked with her hands and notes a lot of wear and tear- thinks its just arthritis. Notes that the joints have been getting stiffer progressively over the past few weeks. States years ago her sed rate was very high and they sent her to rheumatology but she was not having any pain at the time so she did not take any of the medication regimens then. Has not tried anything for this pain yet.   February 2020 had biopsy of left breast, endorses residual intermittent left breast pain. Denies fevers, chills, redness, bruising. Feels is is swollen- but no appreciable swelling.   PHQ2/9: Depression screen [facility] 2/9 10/17/2018 09/01/2018 07/07/2018 06/17/2017  Decreased Interest 0 0 0 0  Down, Depressed, Hopeless 0 0 0 0  PHQ - 2 Score 0 0 0 0  Altered sleeping 0 0 0 -  Tired, decreased energy 0 0 0 -  Change in appetite 0 0 0 -  Feeling bad or failure about yourself  0 0 0 -  Trouble concentrating 0 0 0 -  Moving slowly or fidgety/restless 0 0 0 -  Suicidal thoughts 0 0 0 -  PHQ-9 Score 0 0 0 -  Difficult doing work/chores Not difficult at all Not difficult at all Not difficult at all -     PHQ reviewed. Negative  Patient Active Problem List   Diagnosis Date Noted  . Cyst of skin of breast, left 07/21/2018  . Vitamin D deficiency 07/07/2018  . Preventative health care 07/06/2017  . Osteopenia 06/17/2017    Past Medical History:  Diagnosis Date  . Fibrocystic  breast     Past Surgical History:  Procedure Laterality Date  . ABLATION    . BREAST BIOPSY Left    benign  . BREAST BIOPSY Left    pending path, ribbon clip   . TUBAL LIGATION      Social History   Tobacco Use  . Smoking status: Never Smoker  . Smokeless tobacco: Never Used  Substance Use Topics  . Alcohol use: Yes    Alcohol/week: 5.0 standard drinks    Types: 5 Glasses of wine per week     Current Outpatient Medications:  .  fluticasone (FLONASE) 50 MCG/ACT nasal spray, Place 2 sprays into both nostrils daily. (Patient taking differently: Place 2 sprays into both nostrils as needed. ), Disp: 16 g, Rfl: 2 .  ibuprofen (ADVIL) 200 MG tablet, Take 200 mg by mouth every 6 (six) hours as needed., Disp: , Rfl:  .  nystatin-triamcinolone ointment (MYCOLOG), Apply 1 application topically 2 (two) times daily., Disp: 30 g, Rfl: 0 .  phenylephrine (SUDAFED PE) 10 MG TABS tablet, Take 10 mg by mouth every 4 (four) hours as needed., Disp: , Rfl:  .  loratadine (CLARITIN) 10 MG tablet, Take 1 tablet (10 mg total) by mouth daily. (Patient not taking: Reported on 10/17/2018), Disp:  30 tablet, Rfl: 2 .  Vitamin D, Ergocalciferol, (DRISDOL) 1.25 MG (50000 UT) CAPS capsule, Take 1 capsule (50,000 Units total) by mouth every 7 (seven) days. (Patient not taking: Reported on 10/17/2018), Disp: 4 capsule, Rfl: 1  No Known Allergies  ROS    No other specific complaints in a complete review of systems (except as listed in HPI above).  Objective  Vitals:   10/17/18 1453  BP: 118/70  Pulse: 91  Resp: 14  Temp: 98.1 F (36.7 C)  TempSrc: Oral  SpO2: 98%  Weight: 142 lb 4.8 oz (64.5 kg)  Height: _0  (1.575 m)    Body mass index is 26.03 kg/m.  Nursing Note and Vital Signs reviewed.  Physical Exam Constitutional:      Appearance: Normal appearance.  HENT:     Head: Normocephalic and atraumatic.  Cardiovascular:     Rate and Rhythm: Normal rate.  Pulmonary:     Effort:  Pulmonary effort is normal.  Chest:     Breasts: Breasts are symmetrical.        Left: No swelling, bleeding, inverted nipple, mass, nipple discharge, skin change or tenderness.  Musculoskeletal:     Left hand: She exhibits normal range of motion, normal capillary refill and no deformity. Normal sensation noted. Normal strength noted.       Hands:  Skin:    General: Skin is warm and dry.     Findings: No bruising, erythema or rash.  Neurological:     General: No focal deficit present.     Mental Status: She is alert and oriented to person, place, and time.     Motor: No weakness.  Psychiatric:        Mood and Affect: Mood normal.        Behavior: Behavior normal.      No results found for this or any previous visit (from the past 48 hour(s)).  Assessment & Plan  1. Bilateral hand pain acetaminophen PRN  - Sed Rate (ESR) - C-reactive protein - DG Hand Complete Left; Future - DG Hand Complete Right; Future - Rheumatoid Factor  2. Vitamin D deficiency Patient requesting check; informed may pay out of pocket. Discussed daily recommendations. - Vitamin D (25 hydroxy)  3. Stiffness of joints of both hands - Sed Rate (ESR) - C-reactive protein - Rheumatoid Factor  4. Breast pain Re-assurance provided, if continues follow-up and can consider other options.    Face-to-face time with patient was more than 25 minutes, >50% time spent counseling and coordination of care

## 2018-10-17 NOTE — Patient Instructions (Signed)
-   Can take acetaminophen as directed on bottle for pain - Take breaks from repetitive activities - you should hear back within a week about labs and xrays if you do not please call us  Hand Pain Many things can cause hand pain. Some common causes are:  An injury.  Repeating the same movement with your hand over and over (overuse).  Osteoporosis.  Arthritis.  Lumps in the tendons or joints of the hand and wrist (ganglion cysts).  Infection. Follow these instructions at home: Pay attention to any changes in your symptoms. Take these actions to help with your discomfort:  If directed, put ice on the affected area: ? Put ice in a plastic bag. ? Place a towel between your skin and the bag. ? Leave the ice on for 15-20 minutes, 3?4 times a day for 2 days.  Take over-the-counter and prescription medicines only as told by your health care provider.  Minimize stress on your hands and wrists as much as possible.  Take breaks from repetitive activity often.  Do stretches as told by your health care provider.  Do not do activities that make your pain worse. Contact a health care provider if:  Your pain does not get better after a few days of self-care.  Your pain gets worse.  Your pain affects your ability to do your daily activities. Get help right away if:  Your hand becomes warm, red, or swollen.  Your hand is numb or tingling.  Your hand is extremely swollen or deformed.  Your hand or fingers turn white or blue.  You cannot move your hand, wrist, or fingers. This information is not intended to replace advice given to you by your health care provider. Make sure you discuss any questions you have with your health care provider. Document Released: 05/30/2015 Document Revised: 10/09/2015 Document Reviewed: 05/29/2014 Elsevier Interactive Patient Education  2019 [employer].

## 2018-10-18 ENCOUNTER — Other Ambulatory Visit: Payer: Self-pay | Admitting: Nurse Practitioner

## 2018-10-18 DIAGNOSIS — M79641 Pain in right hand: Secondary | ICD-10-CM

## 2018-10-18 LAB — SEDIMENTATION RATE: Sed Rate: 11 mm/h (ref 0–20)

## 2018-10-18 LAB — VITAMIN D 25 HYDROXY (VIT D DEFICIENCY, FRACTURES): Vit D, 25-Hydroxy: 26 ng/mL — ABNORMAL LOW (ref 30–100)

## 2018-10-18 LAB — RHEUMATOID FACTOR: Rhuematoid fact SerPl-aCnc: 53 IU/mL — ABNORMAL HIGH (ref <14)

## 2018-10-18 LAB — C-REACTIVE PROTEIN: CRP: 2.9 mg/L (ref <8.0)

## 2018-10-19 ENCOUNTER — Other Ambulatory Visit: Payer: Self-pay

## 2018-10-23 ENCOUNTER — Telehealth: Payer: Self-pay

## 2018-10-23 NOTE — Telephone Encounter (Signed)
Left message with patient's husband

## 2018-10-23 NOTE — Telephone Encounter (Signed)
We do not routinely test for lyme disease. Has patient had recent tick bite

## 2018-10-23 NOTE — Telephone Encounter (Signed)
Copied from [place] [id number]. Topic: General - Inquiry >> Oct 23, 2018 10:13 AM Richardo Priest, NT wrote: Reason for CRM: Patient is calling in requesting that she be test for lyme disease. Call back [phone], Patient states she is at work and is requesting a call back whenever possible.

## 2018-11-08 DIAGNOSIS — M25541 Pain in joints of right hand: Secondary | ICD-10-CM | POA: Insufficient documentation

## 2018-11-08 DIAGNOSIS — M25542 Pain in joints of left hand: Secondary | ICD-10-CM

## 2018-11-08 DIAGNOSIS — M059 Rheumatoid arthritis with rheumatoid factor, unspecified: Secondary | ICD-10-CM | POA: Insufficient documentation

## 2018-11-08 HISTORY — DX: Pain in joints of right hand: M25.541

## 2018-11-08 HISTORY — DX: Pain in joints of left hand: M25.542

## 2018-11-23 ENCOUNTER — Encounter: Payer: Self-pay | Admitting: Family Medicine

## 2019-01-05 ENCOUNTER — Telehealth: Payer: Self-pay | Admitting: Family Medicine

## 2019-01-05 NOTE — Telephone Encounter (Signed)
I will work in for [employer]

## 2019-01-05 NOTE — Telephone Encounter (Signed)
Pt siad she thinks it is herpes and that it is painful and wants to know if there is any thing that she can do . Lori Gray said to tell her that she probably needs to go to urgent care and she said that her ins is not covered for urgent cares. Please advise

## 2019-01-08 ENCOUNTER — Encounter: Payer: Self-pay | Admitting: Nurse Practitioner

## 2019-01-08 ENCOUNTER — Other Ambulatory Visit: Payer: Self-pay

## 2019-01-08 ENCOUNTER — Ambulatory Visit (INDEPENDENT_AMBULATORY_CARE_PROVIDER_SITE_OTHER): Payer: Managed Care, Other (non HMO) | Admitting: Nurse Practitioner

## 2019-01-08 VITALS — BP 112/68 | HR 78 | Temp 97.6°F | Resp 16 | Ht 62.0 in | Wt 136.6 lb

## 2019-01-08 DIAGNOSIS — R928 Other abnormal and inconclusive findings on diagnostic imaging of breast: Secondary | ICD-10-CM | POA: Diagnosis not present

## 2019-01-08 DIAGNOSIS — A6000 Herpesviral infection of urogenital system, unspecified: Secondary | ICD-10-CM | POA: Diagnosis not present

## 2019-01-08 DIAGNOSIS — M059 Rheumatoid arthritis with rheumatoid factor, unspecified: Secondary | ICD-10-CM

## 2019-01-08 DIAGNOSIS — M2550 Pain in unspecified joint: Secondary | ICD-10-CM

## 2019-01-08 DIAGNOSIS — D242 Benign neoplasm of left breast: Secondary | ICD-10-CM

## 2019-01-08 MED ORDER — VALACYCLOVIR HCL 1 G PO TABS: 1000.0000 mg | ORAL_TABLET | Freq: Two times a day (BID) | ORAL | 2 refills | Status: DC

## 2019-01-08 NOTE — Patient Instructions (Signed)
We will do your testing at [facility] because they have the test that we need.  See the info below on Herpes simplex.  If you have another rash develop - start the valtrex as soon as you notice symptoms, and please follow up with [place] if you have frequent outbreaks.  We will call you with the lyme testing results. But please be advised that antibody testing does no confirm or rule out Lyme disease, it can just make it more or less likely.   Herpes Simplex Test Why am I having this test? The herpes simplex test is used to check for an infection with the herpes simplex virus (HSV). There are two common types of HSV:  Type 1 (HSV1) often causes cold sores on or around the mouth and sometimes on or around the eyes.  Type 2 (HSV2) is a sexually transmitted infection that causes sores in and around the genitals. You may need to have an HSV test if:  Your health care provider thinks that you may have an HSV infection.  You have a weakened body defense system (immune system) and you have sores around your mouth or genitals that look like HSV eruptions.  You have sex with multiple partners, or your partner has genital herpes.  You are pregnant, have herpes, and are expecting to deliver a baby vaginally in the next 6-8 weeks. What is being tested? There are two types of herpes simplex tests:  Blood test. This test checks the sample for: ? HSV antibodies. Antibodies are proteins that your body makes to help fight infection. This test checks whether antibodies against HSV are in your blood. ? HSV antigens. This checks for the presence of the HSV virus (antigen) in your blood.  Culture test. This test checks for the virus in a sample of fluid from an open sore. Culture tests take several days to complete but are very accurate. What kind of sample is taken?     Samples will be collected according to the type of tests your health care provider orders.  For the blood tests, a blood sample is  usually collected by inserting a needle into a blood vessel.  For a culture test, the sample is usually collected by swabbing the fluid that is coming from an open sore during an active infection (outbreak). How are the results reported? Your test results will be reported as either positive or negative. For this test, normal results are:  Negative for HSV virus or antibodies in your blood.  Negative for HSV virus in cultured fluid. Sometimes, the test results may report that a condition is present when it is not present (false-positive result). Sometimes, the test results may report that a condition is not present when it is present (false-negative result). What do the results mean?  A positive result may indicate that you have an active HSV infection. The presence of HSV1 or HSV2 antigens or antibodies in your blood may indicate an active HSV infection.  A negative result means that you do not have HSV1 or HSV2 virus in your blood. This may mean that you do not have HSV infection. Talk with your health care provider about what your results mean. Questions to ask your health care provider Ask your health care provider, or the department that is doing the test:  When will my results be ready?  How will I get my results?  What are my treatment options?  What other tests do I need?  What are my next steps?  Summary  You may have this test if your health care provider suspects that you have a herpes simplex virus (HSV) infection.  Type 1 (HSV1) often causes cold sores on or around the mouth and sometimes on or around the eyes. Type 2 (HSV2) is a sexually transmitted infection that causes sores in and around the genitals.  The test may be done using a blood sample or a sample of fluid from an open sore.  A positive result may mean that you have an active HSV infection. A negative result means that you probably do not have an active infection. Talk with your health care provider about  what your results mean. This information is not intended to replace advice given to you by your health care provider. Make sure you discuss any questions you have with your health care provider. Document Released: 06/05/2004 Document Revised: 04/15/2017 Document Reviewed: 03/22/2017 Elsevier Patient Education  2020 [employer].

## 2019-01-08 NOTE — Assessment & Plan Note (Signed)
Managed by [facility]

## 2019-01-08 NOTE — Progress Notes (Signed)
Patient ID: Lori Gray, female    DOB: November 09, 1965, 53 y.o.   MRN: [id number]  PCP: Delsa Grana, PA-C  Chief Complaint  Patient presents with  . Rash    patient stated that  . Labs Only    patient would like a test for Lymes    Subjective:   Lori Gray is a 53 y.o. female, presents to clinic with CC of the following:  Rash This is a new problem. The current episode started in the past 7 days (about 6 days ago). Location: pubic area - started red bumps and then quickly started to have small pustules. Pertinent negatives include no diarrhea, fatigue, fever or vomiting.  Described at onset as painful and red with swelling around the bumps, also some itching and burning at onset. She did take pictures of it when it first began there on her phone the area is red with yellow appearing ulcerations in a cluster on her central pubic area I do not see any pustules, vesicles or any folliculitis.  She states that the pain was mild at onset and gradually worsened for about 3 days until it started to drain on its own.  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  Shes been putting tea tree oil on it, they are still "open wounds... crusting over", but the swelling, redness and tightness around it has started to improve.  She denies any vaginal symptoms, denies any swollen lymph nodes in her groin.  She has no new sexual partners.  No history of genital herpes but she does have a history of cold sores.    Patient also request to be tested for Lyme's disease.  She states that she has been asking providers for 10 years to test her but no one has been willing to test her.  She is concerned that she may have chronic Lyme because of 10 years of migrating and gradually worsening joint pain.  She says it began after a visit to [place] 10 years ago at that time she  did have low-grade fever, body aches, swollen lymph nodes.  She says she was never tested or treated for Lyme disease.  Does not have any particular instance of tick bite that was related to the onset of the symptoms 10 years ago but [place] she [place] and always has ticks on her.  She is lived in various areas of the country.  She has apparently been associate with the [place] and has asked multiple providers at the [place] where they will not test her.  She says that she first got tested for rheumatoid arthritis 10 years ago although she did not get a diagnosis at that time and only recently has she gone to [facility] rheumatology with new diagnosis of rheumatoid arthritis and recently started methotrexate.  She says that she does have arthritis in her hands and feet but she also has flares of arthritis in her knees hips and shoulders that come and go.  Pts PHQ screening was positive here today, in the past was negative, but pt declined to talk about this today. Depression screen [facility] 2/9 01/08/2019 10/17/2018 09/01/2018  Decreased Interest 2 0 0  Down, Depressed, Hopeless 2 0 0  PHQ - 2 Score 4 0 0  Altered sleeping 3 0 0  Tired, decreased energy 2 0  0  Change in appetite 0 0 0  Feeling bad or failure about yourself  2 0 0  Trouble concentrating 0 0 0  Moving slowly or fidgety/restless 0 0 0  Suicidal thoughts 0 0 0  PHQ-9 Score 11 0 0  Difficult doing work/chores Somewhat difficult Not difficult at all Not difficult at all     Patient Active Problem List   Diagnosis Date Noted  . Arthralgia of both hands 11/08/2018  . Seropositive rheumatoid arthritis ([place]) 11/08/2018  . Vitamin D deficiency 07/07/2018  . Osteopenia 06/17/2017      Current Outpatient Medications:  .  Cholecalciferol (VITAMIN D-1000 MAX ST) 25 MCG (1000 UT) tablet, Take by mouth., Disp: , Rfl:  .  fluticasone (FLONASE) 50 MCG/ACT nasal spray, Place 2 sprays into both nostrils daily. (Patient taking  differently: Place 2 sprays into both nostrils as needed. ), Disp: 16 g, Rfl: 2 .  folic acid (FOLVITE) 1 MG tablet, , Disp: , Rfl:  .  methotrexate (RHEUMATREX) 2.5 MG tablet, , Disp: , Rfl:  .  valACYclovir (VALTREX) 1000 MG tablet, Take 1 tablet (1,000 mg total) by mouth 2 (two) times daily for 7 days., Disp: 14 tablet, Rfl: 2   No Known Allergies   Family History  Problem Relation Age of Onset  . COPD Mother   . Cancer Mother        lung  . Depression Mother   . Arthritis Mother        rheumatoid  . Arthritis Father   . Cancer Father        unknown  . Diabetes Father   . Heart disease Father        CABG x 4  . Hyperlipidemia Father   . Hypertension Father   . Kidney disease Father   . Stroke Father 54  . Vision loss Father   . Macular degeneration Father        dry  . Cancer Brother      Social History   Socioeconomic History  . Marital status: Married    Spouse name: Lori Gray  . Number of children: 1  . Years of education: 71  . Highest education level: Not on file  Occupational History    Comment: Biochemist, clinical  Social Needs  . Financial resource strain: Not hard at all  . Food insecurity    Worry: Never true    Inability: Never true  . Transportation needs    Medical: No    Non-medical: No  Tobacco Use  . Smoking status: Never Smoker  . Smokeless tobacco: Never Used  Substance and Sexual Activity  . Alcohol use: Yes    Alcohol/week: 5.0 standard drinks    Types: 5 Glasses of wine per week  . Drug use: No  . Sexual activity: Yes    Partners: Male    Birth control/protection: Surgical    Comment: Ablation   Lifestyle  . Physical activity    Days per week: 0 days    Minutes per session: 0 min  . Stress: Rather much  Relationships  . Social Herbalist on phone: Twice a week    Gets together: Once a week    Attends religious service: Never    Active member of club or organization: No    Attends meetings of clubs or organizations:  Never    Relationship status: Married  . Intimate partner violence    Fear of current or ex partner: Patient  refused    Emotionally abused: Patient refused    Physically abused: Patient refused    Forced sexual activity: Patient refused  Other Topics Concern  . Not on file  Social History Narrative  . Not on file    I personally reviewed active problem list, medication list, allergies, social history, notes from last encounter (PCP and rheumatology last two visits), lab results, imaging with the patient/caregiver today.  Review of Systems  Constitutional: Negative.  Negative for activity change, appetite change, chills, diaphoresis, fatigue, fever and unexpected weight change.  HENT: Negative.   Eyes: Negative.   Respiratory: Negative.   Cardiovascular: Negative.   Gastrointestinal: Negative.  Negative for abdominal pain, constipation, diarrhea, nausea and vomiting.  Endocrine: Negative.   Genitourinary: Negative.  Negative for difficulty urinating, dyspareunia, flank pain, frequency, hematuria, pelvic pain, vaginal bleeding, vaginal discharge and vaginal pain.  Musculoskeletal: Positive for arthralgias and myalgias.  Skin: Positive for rash. Negative for color change and pallor.  Allergic/Immunologic: Positive for immunocompromised state.  Neurological: Negative.  Negative for dizziness, weakness, light-headedness and headaches.  Hematological: Negative.  Negative for adenopathy. Does not bruise/bleed easily.  Psychiatric/Behavioral: Negative.   All other systems reviewed and are negative.      Objective:   Vitals:   01/08/19 1021  BP: 112/68  Pulse: 78  Resp: 16  Temp: 97.6 F (36.4 C)  TempSrc: Oral  SpO2: 99%  Weight: 136 lb 9.6 oz (62 kg)  Height: _0  (1.575 m)    Body mass index is 24.98 kg/m.  Physical Exam Vitals signs and nursing note reviewed.  Constitutional:      General: She is not in acute distress.    Appearance: Normal appearance. She is  well-developed. She is not ill-appearing, toxic-appearing or diaphoretic.  HENT:     Head: Normocephalic and atraumatic.     Nose: Nose normal.  Eyes:     General:        Right eye: No discharge.        Left eye: No discharge.     Conjunctiva/sclera: Conjunctivae normal.  Neck:     Trachea: No tracheal deviation.  Cardiovascular:     Rate and Rhythm: Normal rate and regular rhythm.  Pulmonary:     Effort: Pulmonary effort is normal. No respiratory distress.     Breath sounds: No stridor.  Genitourinary:    Pubic Area: Rash present. No pubic lice.      Comments: Central mons pubis area 3x1.5cm area of erythematous base with central yellow to brown crusting, no vesicles or pustules, no induration or tenderness to the area or surrounding tissues No inguinal lymphadenopathy Musculoskeletal: Normal range of motion.  Skin:    General: Skin is warm and dry.     Findings: No rash.  Neurological:     Mental Status: She is alert.     Motor: No weakness or abnormal muscle tone.     Coordination: Coordination normal.     Gait: Gait normal.  Psychiatric:        Behavior: Behavior normal.      Results for orders placed or performed in visit on 10/17/18  Sed Rate (ESR)  Result Value Ref Range   Sed Rate 11 0 - 20 mm/h  C-reactive protein  Result Value Ref Range   CRP 2.9 <8.0 mg/L  Vitamin D (25 hydroxy)  Result Value Ref Range   Vit D, 25-Hydroxy 26 (L) 30 - 100 ng/mL  Rheumatoid Factor  Result Value Ref Range  Rhuematoid fact SerPl-aCnc 53 (H) <14 IU/mL         Assessment & Plan:   1. Genital herpes simplex, unspecified site New onset genital ulcerations clinically consistent with genital herpes.  Pt only hx of cold sores, one partner for several years (low risk).  So will test for HSV.  Possibly brought on by methotrexate and stress? Tx for future possible outbreaks -  valtrex 1000 BID x 5 d.  Educated about HSV types, transmission - handout given. - valACYclovir  (VALTREX) 1000 MG tablet; Take 1 tablet (1,000 mg total) by mouth 2 (two) times daily for 7 days.  Dispense: 14 tablet; Refill: 2 - HSV(herpes smplx)abs-1+2(IgG+IgM)-bld  2. Polyarthralgia Long hx of, at least 10 years - started with some generalized sx.  Pt very adamant that her sx are not all from RA.  She requests testing here today. Lengthy discussion with patient about Lyme's disease testing and her cannot confirm or rule out Lyme's disease but we will do the algorithm recommended by up-to-date for chronic Lyme disease with antibody testing first and reflexive Western blot.  She does believe that over 10 years ago her arthralgias began with what she believes may be related to a tick bite and at that time she did have constitutional symptoms of infection such as low-grade fever, lymphadenopathy, myalgias prior to several years of migratory arthralgias.  She says she has been asking doctors for 10 years to do testing and nobody is ever been willing to test for her.  She also denies any treatment for Lyme disease in the past or when she had acute sx or visits.  - Lyme Ab/Western Blot Reflex - sent to [facility] for testing since they do reflex western blot and I could not find the correct reflex testing with [facility] in-house lab orders (testing for chronic lyme per UpToDate algorithm, discussed limitations with pt and if suggestive of + hx of lyme pt would need to be referred to ID)  3. Seropositive rheumatoid arthritis ([place]) Added dx to chart - reviewed last two visits with [facility] - 10/2018 and 12/2018 - methotrexate (RHEUMATREX) 2.5 MG tablet - folic acid (FOLVITE) 1 MG tablet   4. & 5.  Abnormal mammogram of left breast Breast fibroadenoma, left She asked the nurse to order - reviewed pts chart and all images and biopsies from earlier this year and she was due for a six-month follow-up due to very dense likely benign fibroadenoma/mass in the left breast - MM DIAG BREAST TOMO UNI  LEFT; Future - US BREAST LTD UNI LEFT INC AXILLA; Future     Delsa Grana, PA-C 01/08/19 11:52 AM

## 2019-01-12 ENCOUNTER — Other Ambulatory Visit: Payer: Self-pay | Admitting: Family Medicine

## 2019-01-12 ENCOUNTER — Encounter: Payer: Self-pay | Admitting: Family Medicine

## 2019-01-12 ENCOUNTER — Telehealth: Payer: Self-pay | Admitting: Family Medicine

## 2019-01-12 DIAGNOSIS — R6889 Other general symptoms and signs: Secondary | ICD-10-CM

## 2019-01-12 DIAGNOSIS — A6920 Lyme disease, unspecified: Secondary | ICD-10-CM

## 2019-01-12 LAB — LYME, WESTERN BLOT, SERUM (REFLEXED)
IgG P23 Ab.: ABSENT
IgG P66 Ab.: ABSENT
IgM P23 Ab.: ABSENT
IgM P39 Ab.: ABSENT
IgM P41 Ab.: ABSENT
Lyme IgG Wb: POSITIVE — AB
Lyme IgM Wb: NEGATIVE

## 2019-01-12 LAB — HSV(HERPES SMPLX)ABS-I+II(IGG+IGM)-BLD
HSV 1 Glycoprotein G Ab, IgG: 42.8 index — ABNORMAL HIGH (ref 0.00–0.90)
HSV 2 IgG, Type Spec: >23.6 index — ABNORMAL HIGH (ref 0.00–0.90)
HSVI/II Comb IgM: <0.91 Ratio (ref 0.00–0.90)

## 2019-01-12 LAB — LYME AB/WESTERN BLOT REFLEX
LYME DISEASE AB, QUANT, IGM: <0.8 index (ref 0.00–0.79)
Lyme IgG/IgM Ab: 2.85 {ISR} — ABNORMAL HIGH (ref 0.00–0.90)

## 2019-01-12 NOTE — Telephone Encounter (Signed)
Pt called in because she received a call from Infectious Disease to schedule an apt. Pt says that she hasn't received results. Pt says that she will give the office a call back on Monday for result.

## 2019-01-15 ENCOUNTER — Ambulatory Visit
Admission: RE | Admit: 2019-01-15 | Discharge: 2019-01-15 | Disposition: A | Discharge status: Home / Self Care | Payer: Managed Care, Other (non HMO) | Source: Ambulatory Visit | Attending: Family Medicine | Admitting: Family Medicine

## 2019-01-15 ENCOUNTER — Telehealth: Payer: Self-pay | Admitting: *Deleted

## 2019-01-15 DIAGNOSIS — R928 Other abnormal and inconclusive findings on diagnostic imaging of breast: Secondary | ICD-10-CM | POA: Diagnosis not present

## 2019-01-15 DIAGNOSIS — D242 Benign neoplasm of left breast: Secondary | ICD-10-CM | POA: Insufficient documentation

## 2019-01-15 NOTE — Telephone Encounter (Signed)
Pt returning call for additional lab results. Result note read to pt; verbalizes understanding. States ID has already left message for her to schedule appt and she will call them back to schedule.

## 2019-01-24 ENCOUNTER — Telehealth: Payer: Self-pay | Admitting: Licensed Clinical Social Worker

## 2019-01-24 NOTE — Telephone Encounter (Signed)
I called patient and left her a message that we would need to cancel her appointment for tomorrow morning for possible Lyme Disease because patient has Rheumatoid Arthritis it could cause a false positive and she will need to follow up with her Rheumatologist for evaluation/treatment since she is taking medications for her RA.

## 2019-01-25 ENCOUNTER — Ambulatory Visit: Payer: Managed Care, Other (non HMO) | Admitting: Infectious Diseases

## 2019-01-25 ENCOUNTER — Encounter: Payer: Self-pay | Admitting: Infectious Diseases

## 2019-01-25 ENCOUNTER — Other Ambulatory Visit: Payer: Self-pay

## 2019-02-16 ENCOUNTER — Ambulatory Visit: Payer: Managed Care, Other (non HMO) | Admitting: Surgery

## 2019-03-23 DIAGNOSIS — R768 Other specified abnormal immunological findings in serum: Secondary | ICD-10-CM | POA: Diagnosis not present

## 2019-03-23 DIAGNOSIS — Z79899 Other long term (current) drug therapy: Secondary | ICD-10-CM | POA: Diagnosis not present

## 2019-04-03 DIAGNOSIS — R768 Other specified abnormal immunological findings in serum: Secondary | ICD-10-CM | POA: Diagnosis not present

## 2019-04-03 DIAGNOSIS — M059 Rheumatoid arthritis with rheumatoid factor, unspecified: Secondary | ICD-10-CM | POA: Diagnosis not present

## 2019-05-02 NOTE — Progress Notes (Signed)
This encounter was created in error - please disregard.

## 2019-07-13 ENCOUNTER — Encounter: Payer: Managed Care, Other (non HMO) | Admitting: Family Medicine

## 2019-10-29 DIAGNOSIS — M059 Rheumatoid arthritis with rheumatoid factor, unspecified: Secondary | ICD-10-CM | POA: Diagnosis not present

## 2019-10-29 DIAGNOSIS — R768 Other specified abnormal immunological findings in serum: Secondary | ICD-10-CM | POA: Diagnosis not present

## 2019-11-01 DIAGNOSIS — M059 Rheumatoid arthritis with rheumatoid factor, unspecified: Secondary | ICD-10-CM | POA: Diagnosis not present

## 2019-11-01 DIAGNOSIS — Z79899 Other long term (current) drug therapy: Secondary | ICD-10-CM | POA: Diagnosis not present

## 2019-11-01 DIAGNOSIS — M25541 Pain in joints of right hand: Secondary | ICD-10-CM | POA: Diagnosis not present

## 2019-11-01 DIAGNOSIS — R768 Other specified abnormal immunological findings in serum: Secondary | ICD-10-CM | POA: Diagnosis not present

## 2020-03-14 ENCOUNTER — Encounter: Payer: Self-pay | Admitting: Family Medicine

## 2020-03-14 ENCOUNTER — Ambulatory Visit (INDEPENDENT_AMBULATORY_CARE_PROVIDER_SITE_OTHER): Payer: BC Managed Care – PPO | Admitting: Family Medicine

## 2020-03-14 ENCOUNTER — Other Ambulatory Visit (HOSPITAL_COMMUNITY)
Admission: RE | Admit: 2020-03-14 | Discharge: 2020-03-14 | Disposition: A | Discharge status: Home / Self Care | Payer: BC Managed Care – PPO | Source: Ambulatory Visit | Attending: Family Medicine | Admitting: Family Medicine

## 2020-03-14 ENCOUNTER — Other Ambulatory Visit: Payer: Self-pay

## 2020-03-14 VITALS — BP 122/84 | HR 95 | Temp 97.9°F | Resp 16 | Ht 62.0 in | Wt 135.4 lb

## 2020-03-14 DIAGNOSIS — M810 Age-related osteoporosis without current pathological fracture: Secondary | ICD-10-CM | POA: Diagnosis not present

## 2020-03-14 DIAGNOSIS — D242 Benign neoplasm of left breast: Secondary | ICD-10-CM

## 2020-03-14 DIAGNOSIS — R300 Dysuria: Secondary | ICD-10-CM

## 2020-03-14 DIAGNOSIS — Z0000 Encounter for general adult medical examination without abnormal findings: Secondary | ICD-10-CM | POA: Diagnosis not present

## 2020-03-14 DIAGNOSIS — N760 Acute vaginitis: Secondary | ICD-10-CM

## 2020-03-14 DIAGNOSIS — M059 Rheumatoid arthritis with rheumatoid factor, unspecified: Secondary | ICD-10-CM | POA: Diagnosis not present

## 2020-03-14 DIAGNOSIS — E559 Vitamin D deficiency, unspecified: Secondary | ICD-10-CM

## 2020-03-14 DIAGNOSIS — E785 Hyperlipidemia, unspecified: Secondary | ICD-10-CM

## 2020-03-14 DIAGNOSIS — Z1231 Encounter for screening mammogram for malignant neoplasm of breast: Secondary | ICD-10-CM

## 2020-03-14 HISTORY — DX: Benign neoplasm of left breast: D24.2

## 2020-03-14 NOTE — Patient Instructions (Addendum)
Last Mammogram: FINDINGS: Mammographically, there are no suspicious masses, areas of architectural distortion or suspicious microcalcifications in the left breast. Ribbon shaped marker, from a prior ultrasound-guided core needle biopsy, is located adjacent to a benign-appearing calcified mass in the left breast upper outer quadrant, middle depth.  Mammographic images were processed with CAD.  Targeted ultrasound is performed, showing decreased in size hypoechoic mostly circumscribed mass in the left breast 12 o'clock 5 cm from the nipple measuring 0.6 by 0.5 by 0.6 cm. Clip artifact is seen within the mass. Given the decrease in size post biopsy, and its appearance, this mass is consistent with a benign fibroadenoma.  IMPRESSION: No mammographic evidence of left breast malignancy.  RECOMMENDATION: Bilateral screening mammogram in January 2021.  I have discussed the findings and recommendations with the patient. Results were also provided in writing at the conclusion of the visit. If applicable, a reminder letter will be sent to the patient regarding the next appointment.  BI-RADS CATEGORY  2: Benign.   Electronically Signed   By: Fidela Salisbury M.D.   On: 01/15/2019 15:14   Health Maintenance  Topic Date Due  .  Hepatitis C: One time screening is recommended by Center for Disease Control  (CDC) for  adults born from [birth year] through 1963.   Never done  . HIV Screening  Never done  . Colon Cancer Screening  Never done  . COVID-19 Vaccine (1) 03/30/2020*  . Mammogram  03/31/2020*  . Flu Shot  08/14/2020*  . Pap Smear  07/07/2021  . Tetanus Vaccine  05/18/2023  *Topic was postponed. The date shown is not the original due date.      [facility] [place],  [place]  [id number] Get Driving Directions Main: [phone]      Preventive Care 68-43 Years Old, Female Preventive care refers to visits with  your health care provider and lifestyle choices that can promote health and wellness. This includes:  A yearly physical exam. This may also be called an annual well check.  Regular dental visits and eye exams.  Immunizations.  Screening for certain conditions.  Healthy lifestyle choices, such as eating a healthy diet, getting regular exercise, not using drugs or products that contain nicotine and tobacco, and limiting alcohol use. What can I expect for my preventive care visit? Physical exam Your health care provider will check your:  Height and weight. This may be used to calculate body mass index (BMI), which tells if you are at a healthy weight.  Heart rate and blood pressure.  Skin for abnormal spots. Counseling Your health care provider may ask you questions about your:  Alcohol, tobacco, and drug use.  Emotional well-being.  Home and relationship well-being.  Sexual activity.  Eating habits.  Work and work Statistician.  Method of birth control.  Menstrual cycle.  Pregnancy history. What immunizations do I need?  Influenza (flu) vaccine  This is recommended every year. Tetanus, diphtheria, and pertussis (Tdap) vaccine  You may need a Td booster every 10 years. Varicella (chickenpox) vaccine  You may need this if you have not been vaccinated. Zoster (shingles) vaccine  You may need this after age 36. Measles, mumps, and rubella (MMR) vaccine  You may need at least one dose of MMR if you were born in 1957 or later. You may also need a second dose. Pneumococcal conjugate (PCV13) vaccine  You may need this if you have certain conditions and were not previously vaccinated.  Pneumococcal polysaccharide (PPSV23) vaccine  You may need one or two doses if you smoke cigarettes or if you have certain conditions. Meningococcal conjugate (MenACWY) vaccine  You may need this if you have certain conditions. Hepatitis A vaccine  You may need this if you have  certain conditions or if you travel or work in places where you may be exposed to hepatitis A. Hepatitis B vaccine  You may need this if you have certain conditions or if you travel or work in places where you may be exposed to hepatitis B. Haemophilus influenzae type b (Hib) vaccine  You may need this if you have certain conditions. Human papillomavirus (HPV) vaccine  If recommended by your health care provider, you may need three doses over 6 months. You may receive vaccines as individual doses or as more than one vaccine together in one shot (combination vaccines). Talk with your health care provider about the risks and benefits of combination vaccines. What tests do I need? Blood tests  Lipid and cholesterol levels. These may be checked every 5 years, or more frequently if you are over 46 years old.  Hepatitis C test.  Hepatitis B test. Screening  Lung cancer screening. You may have this screening every year starting at age 44 if you have a 30-pack-year history of smoking and currently smoke or have quit within the past 15 years.  Colorectal cancer screening. All adults should have this screening starting at age 40 and continuing until age 61. Your health care provider may recommend screening at age 15 if you are at increased risk. You will have tests every 1-10 years, depending on your results and the type of screening test.  Diabetes screening. This is done by checking your blood sugar (glucose) after you have not eaten for a while (fasting). You may have this done every 1-3 years.  Mammogram. This may be done every 1-2 years. Talk with your health care provider about when you should start having regular mammograms. This may depend on whether you have a family history of breast cancer.  BRCA-related cancer screening. This may be done if you have a family history of breast, ovarian, tubal, or peritoneal cancers.  Pelvic exam and Pap test. This may be done every 3 years starting at  age 55. Starting at age 84, this may be done every 5 years if you have a Pap test in combination with an HPV test. Other tests  Sexually transmitted disease (STD) testing.  Bone density scan. This is done to screen for osteoporosis. You may have this scan if you are at high risk for osteoporosis. Follow these instructions at home: Eating and drinking  Eat a diet that includes fresh fruits and vegetables, whole grains, lean protein, and low-fat dairy.  Take vitamin and mineral supplements as recommended by your health care provider.  Do not drink alcohol if: ? Your health care provider tells you not to drink. ? You are pregnant, may be pregnant, or are planning to become pregnant.  If you drink alcohol: ? Limit how much you have to 0-1 drink a day. ? Be aware of how much alcohol is in your drink. In the [place] one drink equals one 12 oz bottle of beer (355 mL), one 5 oz glass of wine (148 mL), or one 1 oz glass of hard liquor (44 mL). Lifestyle  Take daily care of your teeth and gums.  Stay active. Exercise for at least 30 minutes on 5 or more days each week.  Do not use any products that contain nicotine or tobacco, such as cigarettes, e-cigarettes, and chewing tobacco. If you need help quitting, ask your health care provider.  If you are sexually active, practice safe sex. Use a condom or other form of birth control (contraception) in order to prevent pregnancy and STIs (sexually transmitted infections).  If told by your health care provider, take low-dose aspirin daily starting at age 42. What's next?  Visit your health care provider once a year for a well check visit.  Ask your health care provider how often you should have your eyes and teeth checked.  Stay up to date on all vaccines. This information is not intended to replace advice given to you by your health care provider. Make sure you discuss any questions you have with your health care provider. Document Revised:  01/12/2018 Document Reviewed: 01/12/2018 Elsevier Patient Education  2020 [employer].

## 2020-03-14 NOTE — Progress Notes (Signed)
Patient: Lori Gray, Female    DOB: 1966/05/14, 54 y.o.   MRN: [id number] Delsa Grana, PA-C Visit Date: 03/14/2020  Today's Provider: Delsa Grana, PA-C   Chief Complaint  Patient presents with  . Annual Exam   Subjective:   Annual physical exam:  Lori Gray is a 54 y.o. female who presents today for complete physical exam:  Exercise/Activity: 5d a wk 60 min  Pt has vaginal? Urinary complaints, Hx - Herpes she complains of vulvovaginal itching, no severe pain, some suprapubic pressure, urinary freq w/o hematuria or urgency  Low Vit D and hx of osteoporosis - not taking her Vit D right now  RA managed by Dr. Jefm Bryant - on methotrexate, not consistently taking either, not takig folic acid  Noted stress - but refused most screening questions today, and most preventitive med, testing and immunizations Pt wished to discuss acute complaints and do routine f/up on chronic conditions today in addition to CPE. Advised pt of separate visit billing/coding - and copays for additional labs  USPSTF grade A and B recommendations - reviewed and addressed today  Depression:  Phq 9 completed today by patient, was reviewed by me with patient in the room PHQ score is neg, pt feels good PHQ 2/9 Scores 03/14/2020 01/08/2019 10/17/2018 09/01/2018  PHQ - 2 Score 0 4 0 0  PHQ- 9 Score - 11 0 0   Depression screen [facility] 2/9 03/14/2020 01/08/2019 10/17/2018 09/01/2018 07/07/2018  Decreased Interest 0 2 0 0 0  Down, Depressed, Hopeless 0 2 0 0 0  PHQ - 2 Score 0 4 0 0 0  Altered sleeping - 3 0 0 0  Tired, decreased energy - 2 0 0 0  Change in appetite - 0 0 0 0  Feeling bad or failure about yourself  - 2 0 0 0  Trouble concentrating - 0 0 0 0  Moving slowly or fidgety/restless - 0 0 0 0  Suicidal thoughts - 0 0 0 0  PHQ-9 Score - 11 0 0 0  Difficult doing work/chores - Somewhat difficult Not difficult at all Not difficult at all Not difficult at all    Alcohol screening:   Office Visit  from 03/14/2020 in [facility]  AUDIT-C Score 0      Immunizations and Health Maintenance: Health Maintenance  Topic Date Due  . Hepatitis C Screening  Never done  . HIV Screening  Never done  . COLONOSCOPY  Never done  . COVID-19 Vaccine (1) 03/30/2020 (Originally 12/11/1979)  . MAMMOGRAM  03/31/2020 (Originally 05/20/2019)  . INFLUENZA VACCINE  08/14/2020 (Originally 12/16/2019)  . PAP SMEAR-Modifier  07/07/2021  . TETANUS/TDAP  05/18/2023    Hep C Screening:  refuses  STD testing and prevention (HIV/chl/gon/syphilis):  see above, no additional testing desired by pt today  Intimate partner violence:refuses to answer/discuss  Sexual History/Pain during Intercourse: Married - no concerns  Menstrual History/LMP/Abnormal Bleeding:  LMP June 2021 No LMP recorded. Patient has had an ablation.  Incontinence Symptoms:   none  Breast cancer: mammogram due Last Mammogram: *see HM list above   Cervical cancer screening: UTD Pt denies family hx of cancers - breast, ovarian, uterine, colon:     Osteoporosis:  Dx of osteopenia- [facility] in the [facility], she states she has very brittle bones, no dexa here, she doesn't want to do treatments, previously was prescribed fosamax - dentist did not clear her for use of fosamax  Discussion on osteoporosis per age, including high  calcium and vitamin D supplementation, weight bearing exercises Pt is not supplementing with daily calcium/Vit D.   Skin cancer:  Hx of skin CA -  NO  Discussed atypical lesions    Colorectal cancer:   Colonoscopy is due Discussed concerning signs and sx of CRC, pt denies  Lung cancer:   Low Dose CT Chest recommended if Age 60-80 years, 30 pack-year currently smoking OR have quit w/in 15years. Patient does not qualify.    Social History   Tobacco Use  . Smoking status: Never Smoker  . Smokeless tobacco: Never Used  Vaping Use  . Vaping Use: Never used  Substance Use Topics  . Alcohol use: Yes      Alcohol/week: 5.0 standard drinks    Types: 5 Glasses of wine per week  . Drug use: No       Office Visit from 03/14/2020 in [facility]  AUDIT-C Score 0      Family History  Problem Relation Age of Onset  . COPD Mother   . Cancer Mother        lung  . Depression Mother   . Arthritis Mother        rheumatoid  . Arthritis Father   . Cancer Father        unknown  . Diabetes Father   . Heart disease Father        CABG x 4  . Hyperlipidemia Father   . Hypertension Father   . Kidney disease Father   . Stroke Father 44  . Vision loss Father   . Macular degeneration Father        dry  . Cancer Brother      Blood pressure/Hypertension: BP Readings from Last 3 Encounters:  03/14/20 122/84  01/25/19 121/85  01/08/19 112/68    Weight/Obesity: Wt Readings from Last 3 Encounters:  03/14/20 135 lb 6.4 oz (61.4 kg)  01/25/19 132 lb (59.9 kg)  01/08/19 136 lb 9.6 oz (62 kg)   BMI Readings from Last 3 Encounters:  03/14/20 24.76 kg/m  01/25/19 24.14 kg/m  01/08/19 24.98 kg/m     Lipids:  Lab Results  Component Value Date   CHOL 213 (H) 07/07/2018   CHOL 213 (H) 06/17/2017   Lab Results  Component Value Date   HDL 74 07/07/2018   HDL 79 06/17/2017   Lab Results  Component Value Date   LDLCALC 125 (H) 07/07/2018   LDLCALC 117 (H) 06/17/2017   Lab Results  Component Value Date   TRIG 51 07/07/2018   TRIG 77 06/17/2017   Lab Results  Component Value Date   CHOLHDL 2.9 07/07/2018   CHOLHDL 2.7 06/17/2017   No results found for: LDLDIRECT Based on the results of lipid panel his/her cardiovascular risk factor ( using [facility] )  in the next 10 years is: The 10-year ASCVD risk score [name]., et al., 2013) is: 1%   Values used to calculate the score:     Age: 66 years     Sex: Female     Is Non-Hispanic African American: No     Diabetic: No     Tobacco smoker: No     Systolic Blood Pressure: 973 mmHg     Is BP treated:  No     HDL Cholesterol: 74 mg/dL     Total Cholesterol: 213 mg/dL Glucose:  Glucose, Bld  Date Value Ref Range Status  07/07/2018 86 65 - 99 mg/dL Final  Comment:    .            Fasting reference interval .   06/17/2017 84 65 - 139 mg/dL Final    Comment:    .        Non-fasting reference interval .    Hypertension: BP Readings from Last 3 Encounters:  03/14/20 122/84  01/25/19 121/85  01/08/19 112/68   Obesity: Wt Readings from Last 3 Encounters:  03/14/20 135 lb 6.4 oz (61.4 kg)  01/25/19 132 lb (59.9 kg)  01/08/19 136 lb 9.6 oz (62 kg)   BMI Readings from Last 3 Encounters:  03/14/20 24.76 kg/m  01/25/19 24.14 kg/m  01/08/19 24.98 kg/m      Social History      She        Social History   Socioeconomic History  . Marital status: Married    Spouse name: Herbie [place]  . Number of children: 1  . Years of education: 61  . Highest education level: Not on file  Occupational History    Comment: Biochemist, clinical  Tobacco Use  . Smoking status: Never Smoker  . Smokeless tobacco: Never Used  Vaping Use  . Vaping Use: Never used  Substance and Sexual Activity  . Alcohol use: Yes    Alcohol/week: 5.0 standard drinks    Types: 5 Glasses of wine per week  . Drug use: No  . Sexual activity: Yes    Partners: Male    Birth control/protection: Surgical    Comment: Ablation   Other Topics Concern  . Not on file  Social History Narrative  . Not on file   Social Determinants of Health   Financial Resource Strain: Unknown  . Difficulty of Paying Living Expenses: Patient refused  Food Insecurity: Unknown  . Worried About Charity fundraiser in the Last Year: Patient refused  . Ran Out of Food in the Last Year: Patient refused  Transportation Needs: Unknown  . Lack of Transportation (Medical): Patient refused  . Lack of Transportation (Non-Medical): Patient refused  Physical Activity: Sufficiently Active  . Days of Exercise per Week: 5 days  . Minutes of  Exercise per Session: 60 min  Stress: Unknown  . Feeling of Stress : Patient refused  Social Connections: Unknown  . Frequency of Communication with Friends and Family: Patient refused  . Frequency of Social Gatherings with Friends and Family: Patient refused  . Attends Religious Services: Patient refused  . Active Member of Clubs or Organizations: Patient refused  . Attends Archivist Meetings: Patient refused  . Marital Status: Patient refused    Family History        Family History  Problem Relation Age of Onset  . COPD Mother   . Cancer Mother        lung  . Depression Mother   . Arthritis Mother        rheumatoid  . Arthritis Father   . Cancer Father        unknown  . Diabetes Father   . Heart disease Father        CABG x 4  . Hyperlipidemia Father   . Hypertension Father   . Kidney disease Father   . Stroke Father 98  . Vision loss Father   . Macular degeneration Father        dry  . Cancer Brother     Patient Active Problem List   Diagnosis Date Noted  . Arthralgia of both hands 11/08/2018  .  Seropositive rheumatoid arthritis ([place]) 11/08/2018  . Vitamin D deficiency 07/07/2018  . Osteopenia 06/17/2017    Past Surgical History:  Procedure Laterality Date  . ABLATION    . BREAST BIOPSY Left    benign  . BREAST BIOPSY Left 06/20/2018   neg, ribbon clip   . TUBAL LIGATION       Current Outpatient Medications:  .  Cholecalciferol (VITAMIN D-1000 MAX ST) 25 MCG (1000 UT) tablet, Take by mouth., Disp: , Rfl:  .  fluticasone (FLONASE) 50 MCG/ACT nasal spray, Place 2 sprays into both nostrils daily. (Patient taking differently: Place 2 sprays into both nostrils as needed. ), Disp: 16 g, Rfl: 2 .  folic acid (FOLVITE) 1 MG tablet, , Disp: , Rfl:  .  methotrexate (RHEUMATREX) 2.5 MG tablet, , Disp: , Rfl:   No Known Allergies  Patient Care Team: Delsa Grana, PA-C as PCP - General (Family Medicine)  Review of Systems  Constitutional:  Negative.   HENT: Negative.   Eyes: Negative.   Respiratory: Negative.   Cardiovascular: Negative.   Gastrointestinal: Negative.   Endocrine: Negative.   Genitourinary: Negative.   Musculoskeletal: Negative.   Skin: Negative.   Allergic/Immunologic: Negative.   Neurological: Negative.   Hematological: Negative.   Psychiatric/Behavioral: Negative.   All other systems reviewed and are negative.    I personally reviewed active problem list, medication list, allergies, family history, social history, health maintenance, notes from last encounter, lab results, imaging with the patient/caregiver today.        Objective:   Vitals:  Vitals:   03/14/20 0850  BP: 122/84  Pulse: 95  Resp: 16  Temp: 97.9 F (36.6 C)  TempSrc: Oral  SpO2: 99%  Weight: 135 lb 6.4 oz (61.4 kg)  Height: 5\' 2"  (1.575 m)    Body mass index is 24.76 kg/m.  Physical Exam Vitals and nursing note reviewed. Exam conducted with a chaperone present.  Constitutional:      General: She is not in acute distress.    Appearance: Normal appearance. She is well-developed and normal weight. She is not ill-appearing, toxic-appearing or diaphoretic.  HENT:     Head: Normocephalic and atraumatic.     Right Ear: External ear normal.     Left Ear: External ear normal.     Nose: Nose normal.     Mouth/Throat:     Mouth: Mucous membranes are moist.     Pharynx: Oropharynx is clear. Uvula midline.  Eyes:     General: Lids are normal. No scleral icterus.       Right eye: No discharge.        Left eye: No discharge.     Conjunctiva/sclera: Conjunctivae normal.     Pupils: Pupils are equal, round, and reactive to light.  Neck:     Trachea: Phonation normal. No tracheal deviation.  Cardiovascular:     Rate and Rhythm: Normal rate and regular rhythm.     Pulses: Normal pulses.          Radial pulses are 2+ on the right side and 2+ on the left side.       Posterior tibial pulses are 2+ on the right side and 2+ on  the left side.     Heart sounds: Normal heart sounds. No murmur heard.  No friction rub. No gallop.   Pulmonary:     Effort: Pulmonary effort is normal. No respiratory distress.     Breath sounds: Normal breath sounds. No stridor. No  wheezing, rhonchi or rales.  Chest:     Chest wall: No tenderness.     Breasts: Breasts are asymmetrical.        Right: Normal. No swelling, bleeding, inverted nipple, mass, nipple discharge, skin change or tenderness.        Left: Normal. No swelling, bleeding, inverted nipple, mass, nipple discharge, skin change or tenderness.     Comments: Left breast > R Abdominal:     General: Bowel sounds are normal. There is no distension.     Palpations: Abdomen is soft.     Tenderness: There is no abdominal tenderness. There is no right CVA tenderness, left CVA tenderness, guarding or rebound.  Genitourinary:    Comments: External GU exam done Some visible vaginal discharge white to mucoid Labia minoral and clitoral hood slightly erythematous  No rash No lesions or ulcers Musculoskeletal:     Cervical back: Normal range of motion and neck supple.  Lymphadenopathy:     Cervical: No cervical adenopathy.     Upper Body:     Right upper body: No supraclavicular, axillary or pectoral adenopathy.     Left upper body: No supraclavicular, axillary or pectoral adenopathy.  Skin:    General: Skin is warm and dry.     Capillary Refill: Capillary refill takes less than 2 seconds.     Coloration: Skin is not jaundiced or pale.     Findings: No rash.  Neurological:     Mental Status: She is alert. Mental status is at baseline.     Motor: No abnormal muscle tone.     Gait: Gait normal.  Psychiatric:        Mood and Affect: Mood normal.        Speech: Speech normal.        Behavior: Behavior normal.       Fall Risk: Fall Risk  03/14/2020 01/08/2019 10/17/2018 09/01/2018 08/04/2018  Falls in the past year? 0 0 0 0 0  Number falls in past yr: 0 0 - 0 -  Injury with  Fall? 0 0 - 0 0  Follow up Falls evaluation completed - - - -    Functional Status Survey: Is the patient deaf or have difficulty hearing?: No Does the patient have difficulty seeing, even when wearing glasses/contacts?: Yes Does the patient have difficulty concentrating, remembering, or making decisions?: No Does the patient have difficulty walking or climbing stairs?: No Does the patient have difficulty dressing or bathing?: No Does the patient have difficulty doing errands alone such as visiting a doctor's office or shopping?: No   Assessment & Plan:    CPE completed today  . USPSTF grade A and B recommendations reviewed with patient; age-appropriate recommendations, preventive care, screening tests, etc discussed and encouraged; healthy living encouraged; see AVS for patient education given to patient  . Discussed importance of 150 minutes of physical activity weekly, AHA exercise recommendations given to pt in AVS/handout  . Discussed importance of healthy diet:  eating lean meats and proteins, avoiding trans fats and saturated fats, avoid simple sugars and excessive carbs in diet, eat 6 servings of fruit/vegetables daily and drink plenty of water and avoid sweet beverages.    . Recommended pt to do annual eye exam and routine dental exams/cleanings  . Depression, alcohol, fall screening completed as documented above and per flowsheets  . Reviewed Health Maintenance: Health Maintenance  Topic Date Due  . Hepatitis C Screening  Never done  . HIV Screening  Never  done  . COLONOSCOPY  Never done  . COVID-19 Vaccine (1) 03/30/2020 (Originally 12/11/1979)  . MAMMOGRAM  03/31/2020 (Originally 05/20/2019)  . INFLUENZA VACCINE  08/14/2020 (Originally 12/16/2019)  . PAP SMEAR-Modifier  07/07/2021  . TETANUS/TDAP  05/18/2023     ICD-10-CM   1. Adult general medical exam  Z00.00 CBC with Differential/Platelet    COMPLETE METABOLIC PANEL WITH GFR    Lipid panel  2. Encounter for  screening mammogram for malignant neoplasm of breast  Z12.31 MM 3D SCREEN BREAST BILATERAL  3. Vitamin D deficiency  H15.0 COMPLETE METABOLIC PANEL WITH GFR    VITAMIN D 25 Hydroxy (Vit-D Deficiency, Fractures)  4. Osteoporosis, unspecified osteoporosis type, unspecified pathological fracture presence  V69.7 COMPLETE METABOLIC PANEL WITH GFR    VITAMIN D 25 Hydroxy (Vit-D Deficiency, Fractures)  5. Breast fibroadenoma, left  D24.2   6. Seropositive rheumatoid arthritis (HCC)  M05.9 CBC with Differential/Platelet    COMPLETE METABOLIC PANEL WITH GFR  7. Acute vaginitis  N76.0 Cervicovaginal ancillary only   possibly yeast?  pt did not want to go ahead a treat, she wanted to wait for test results first, can use external miconazole   8. Dysuria  R30.0 Cervicovaginal ancillary only    POCT urinalysis dipstick   screen urine, no CVA tenderness, no abd pain - abd exam benign   9. Hyperlipidemia, unspecified hyperlipidemia type  X48.0 COMPLETE METABOLIC PANEL WITH GFR    Lipid panel   reviewed lipids, recheck labs       Delsa Grana, PA-C 03/14/20 9:06 AM  [facility]

## 2020-03-15 LAB — COMPLETE METABOLIC PANEL WITH GFR
AG Ratio: 1.8 (calc) (ref 1.0–2.5)
ALT: 12 U/L (ref 6–29)
AST: 19 U/L (ref 10–35)
Albumin: 4.9 g/dL (ref 3.6–5.1)
Alkaline phosphatase (APISO): 94 U/L (ref 37–153)
BUN: 11 mg/dL (ref 7–25)
CO2: 26 mmol/L (ref 20–32)
Calcium: 9.9 mg/dL (ref 8.6–10.4)
Chloride: 102 mmol/L (ref 98–110)
Creat: 0.63 mg/dL (ref 0.50–1.05)
GFR, Est African American: 120 mL/min/{1.73_m2} (ref >=60)
GFR, Est Non African American: 103 mL/min/{1.73_m2} (ref >=60)
Globulin: 2.8 g/dL (calc) (ref 1.9–3.7)
Glucose, Bld: 89 mg/dL (ref 65–99)
Potassium: 4.4 mmol/L (ref 3.5–5.3)
Sodium: 140 mmol/L (ref 135–146)
Total Bilirubin: 0.9 mg/dL (ref 0.2–1.2)
Total Protein: 7.7 g/dL (ref 6.1–8.1)

## 2020-03-15 LAB — LIPID PANEL
Cholesterol: 249 mg/dL — ABNORMAL HIGH (ref <200)
HDL: 98 mg/dL (ref >=50)
LDL Cholesterol (Calc): 136 mg/dL (calc) — ABNORMAL HIGH
Non-HDL Cholesterol (Calc): 151 mg/dL (calc) — ABNORMAL HIGH (ref <130)
Total CHOL/HDL Ratio: 2.5 (calc) (ref <5.0)
Triglycerides: 62 mg/dL (ref <150)

## 2020-03-15 LAB — CBC WITH DIFFERENTIAL/PLATELET
Absolute Monocytes: 492 cells/uL (ref 200–950)
Basophils Absolute: 60 cells/uL (ref 0–200)
Basophils Relative: 1 %
Eosinophils Absolute: 60 cells/uL (ref 15–500)
Eosinophils Relative: 1 %
HCT: 42.9 % (ref 35.0–45.0)
Hemoglobin: 14.6 g/dL (ref 11.7–15.5)
Lymphs Abs: 1992 cells/uL (ref 850–3900)
MCH: 31.9 pg (ref 27.0–33.0)
MCHC: 34 g/dL (ref 32.0–36.0)
MCV: 93.7 fL (ref 80.0–100.0)
MPV: 10.5 fL (ref 7.5–12.5)
Monocytes Relative: 8.2 %
Neutro Abs: 3396 cells/uL (ref 1500–7800)
Neutrophils Relative %: 56.6 %
Platelets: 285 10*3/uL (ref 140–400)
RBC: 4.58 10*6/uL (ref 3.80–5.10)
RDW: 13 % (ref 11.0–15.0)
Total Lymphocyte: 33.2 %
WBC: 6 10*3/uL (ref 3.8–10.8)

## 2020-03-15 LAB — VITAMIN D 25 HYDROXY (VIT D DEFICIENCY, FRACTURES): Vit D, 25-Hydroxy: 19 ng/mL — ABNORMAL LOW (ref 30–100)

## 2020-03-17 LAB — CERVICOVAGINAL ANCILLARY ONLY
Bacterial Vaginitis (gardnerella): NEGATIVE
Candida Glabrata: NEGATIVE
Candida Vaginitis: NEGATIVE
Comment: NEGATIVE
Comment: NEGATIVE
Comment: NEGATIVE

## 2020-04-17 DIAGNOSIS — Z20822 Contact with and (suspected) exposure to covid-19: Secondary | ICD-10-CM | POA: Diagnosis not present

## 2020-05-23 ENCOUNTER — Telehealth: Payer: Self-pay | Admitting: Family Medicine

## 2020-05-23 NOTE — Telephone Encounter (Signed)
Patient is calling to request a call back from the office manager regarding taking off a charge for an office visit. CB- [phone]

## 2020-06-28 IMAGING — MG DIGITAL DIAGNOSTIC BILATERAL MAMMOGRAM WITH TOMO AND CAD
8 of 14 series · 8 of 40 positions shown · non-contrast
Comparison: Previous exam(s).

CLINICAL DATA: Possible bilateral masses seen on most recent
screening mammography.

EXAM:
DIGITAL DIAGNOSTIC BILATERAL MAMMOGRAM WITH CAD AND TOMO
ULTRASOUND BILATERAL BREAST

[L MLO synth-2D (1 of 2)]
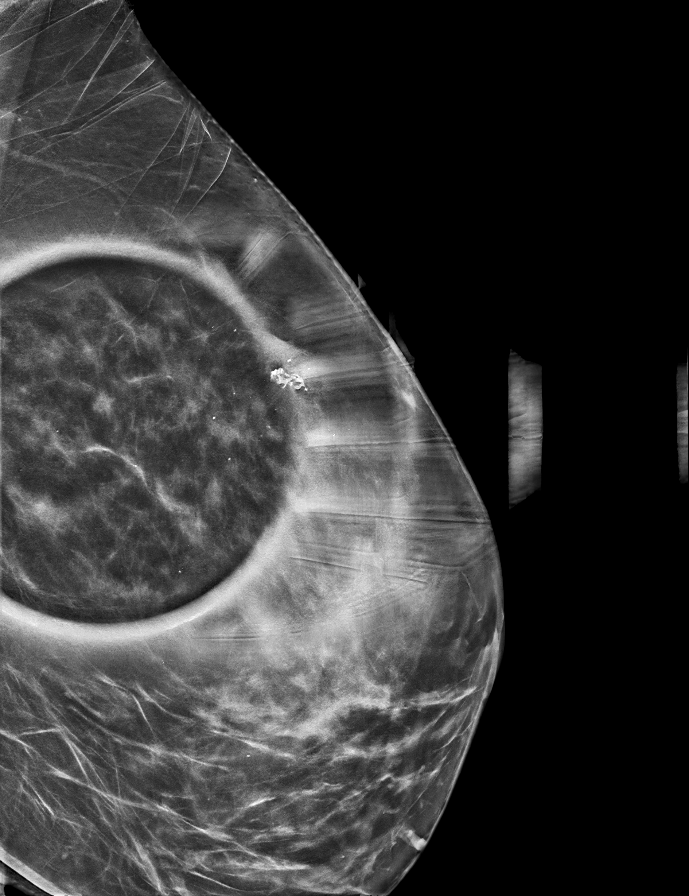

[L CC synth-2D (1 of 2)]
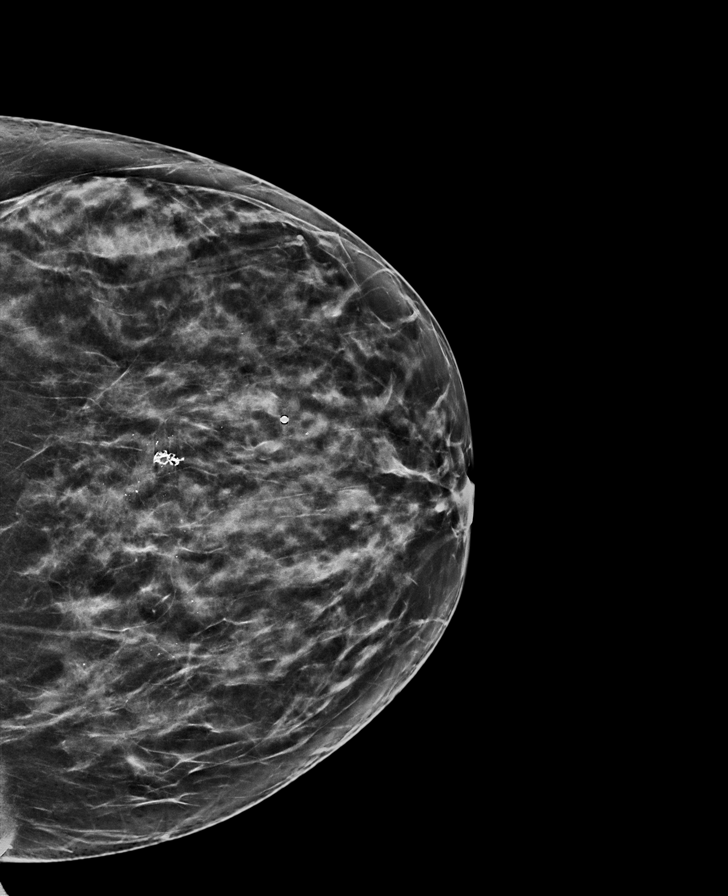

[R MLO synth-2D (1 of 2)]
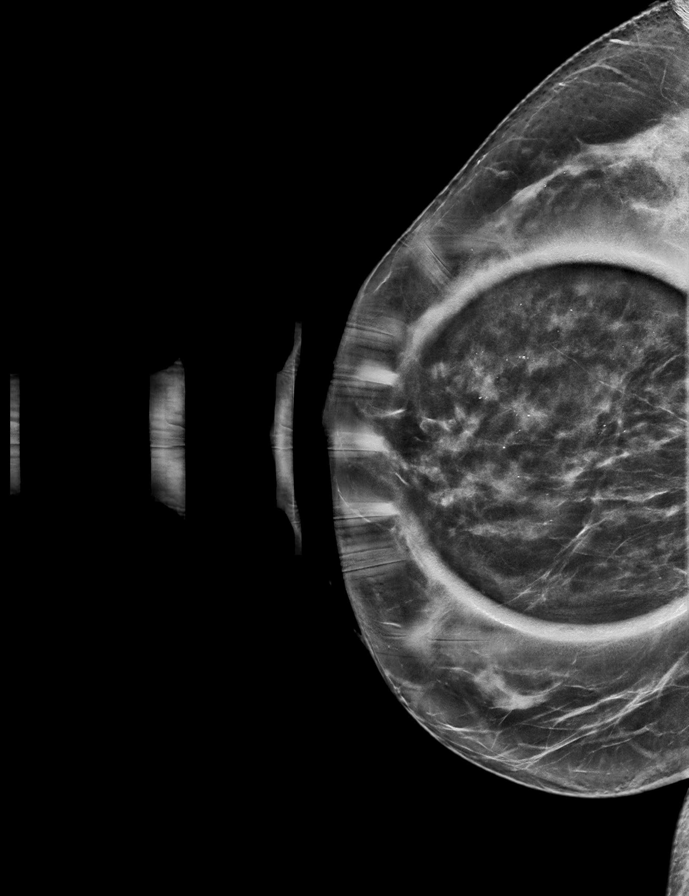

[R CC synth-2D]
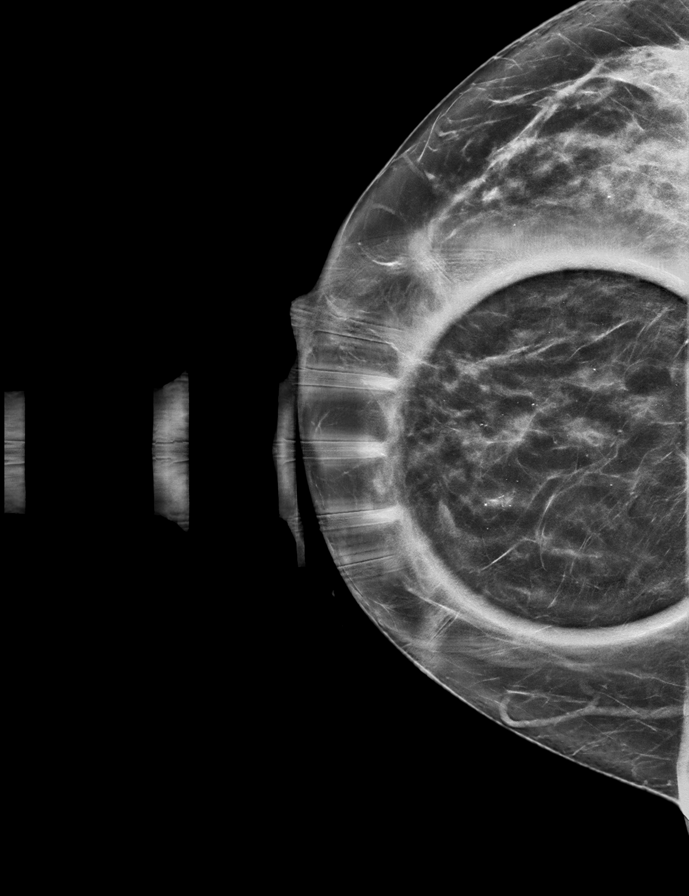

[L CC synth-2D (2 of 2)]
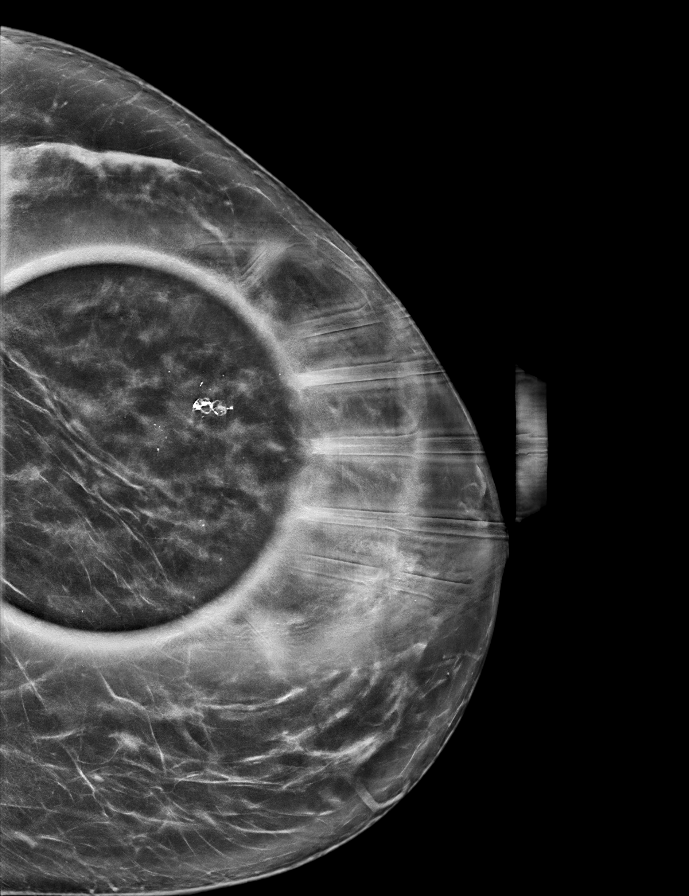

[R MLO synth-2D (2 of 2)]
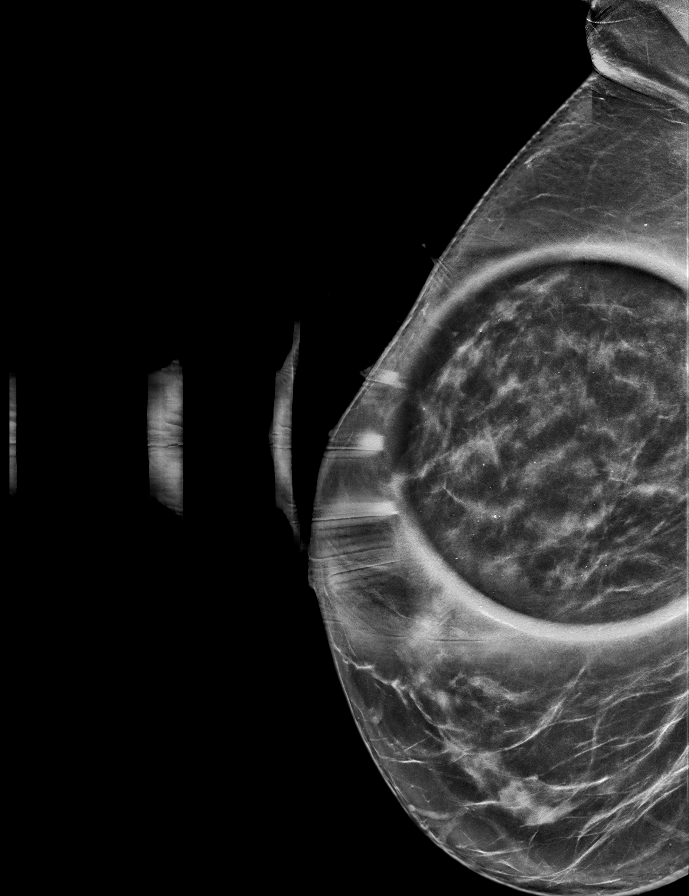

[L MLO synth-2D (2 of 2)]
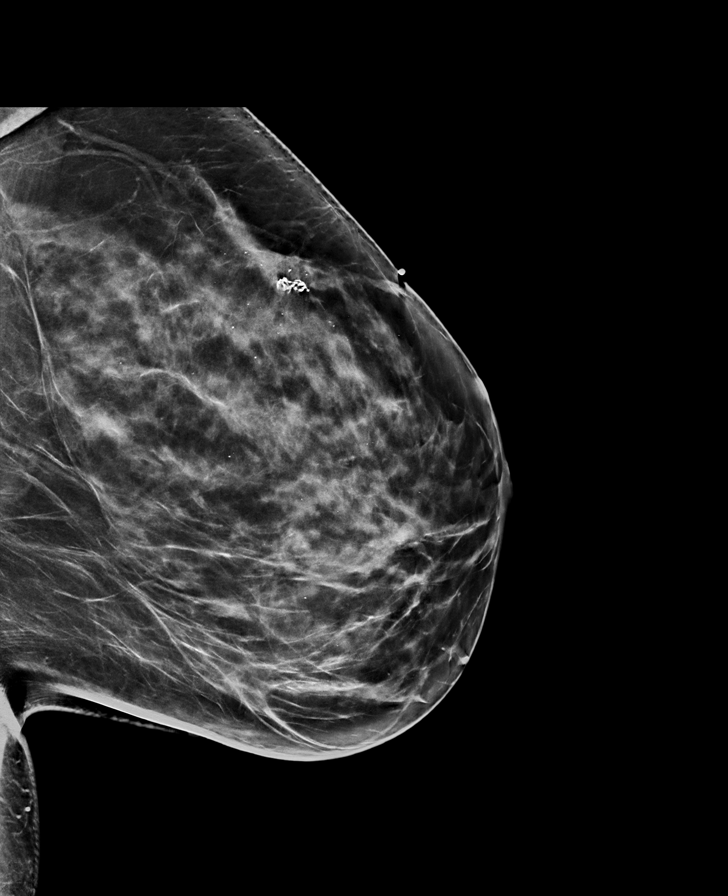

[R MLO tomo · tomo slice 41/80.0]
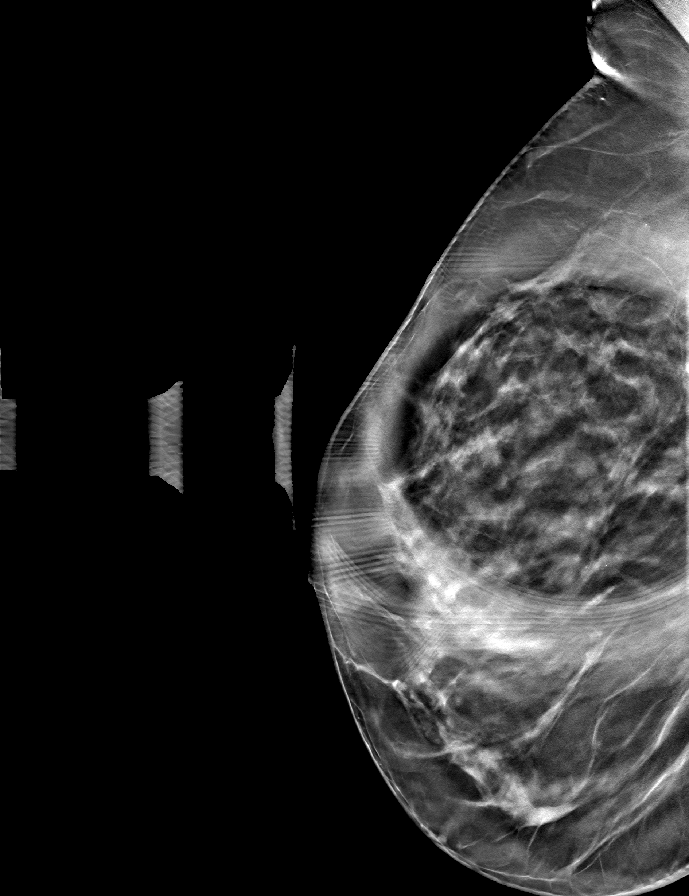

[8 of 40 positions shown; findings below may reference images not displayed]

ACR Breast Density Category c: The breast tissue is heterogeneously
dense, which may obscure small masses.
FINDINGS: Mammographically, there is a low-density circumscribed mass in the
left breast slightly upper inner quadrant, middle depth. There is a
benign calcified mass in the left breast slightly upper outer
quadrant, middle depth. In the right breast, there is a persistent
circumscribed less than 1 cm low-density mass, in the slightly upper
inner quadrant, middle depth.

Mammographic images were processed with CAD.

On physical exam, no suspicious masses are palpated.

Targeted right breast ultrasound is performed, showing no suspicious
masses or shadowing lesions. Mild symmetric duct ectasia is noted,
which may account for the mammographic finding.

Targeted left breast ultrasound is performed, showing a
benign-appearing complicated cyst or cluster of cysts in the left
breast 11:30 o'clock 3 cm from the nipple measuring 0.8 x 0.5 x
cm. This finding likely corresponds to the mammographically seen
benign-appearing mass. In the left 12 o'clock breast 5 cm from the
nipple there is a hypoechoic slightly irregular mass which measures
0.7 x 0.6 x 0.9 cm.
IMPRESSION: No mammographic or sonographic evidence of malignancy in the right
breast.

Left breast 12 o'clock 5 cm from the nipple indeterminate 9 mm mass,
for which ultrasound-guided core needle biopsy is recommended.

RECOMMENDATION:
Ultrasound-guided core needle biopsy of the left breast.

I have discussed the findings and recommendations with the patient.
Results were also provided in writing at the conclusion of the
visit. If applicable, a reminder letter will be sent to the patient
regarding the next appointment.

BI-RADS CATEGORY  4: Suspicious.

## 2020-07-30 IMAGING — MG MM CLIP PLACEMENT
2 series · 2 of 2 positions shown · non-contrast
Comparison: Previous exam(s).

CLINICAL DATA: Evaluate biopsy marker

EXAM:
DIAGNOSTIC LEFT MAMMOGRAM POST ULTRASOUND BIOPSY

[L ML]
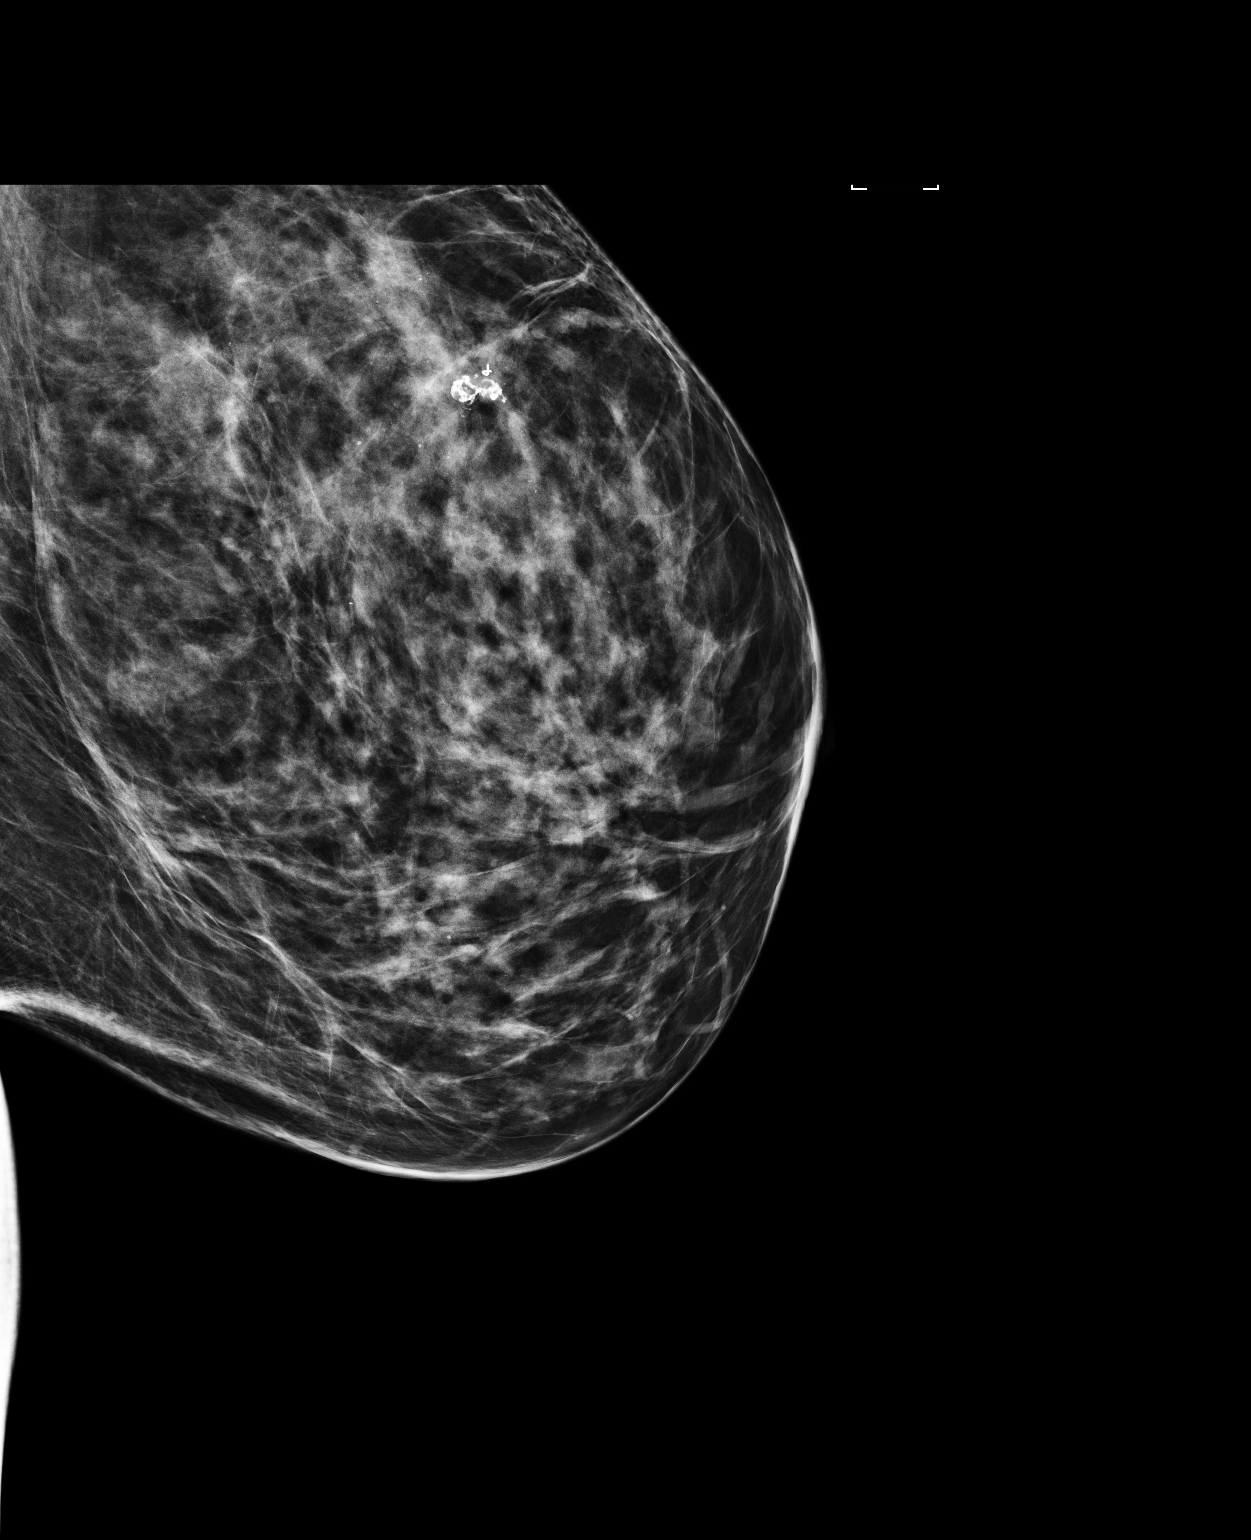

[L CC]
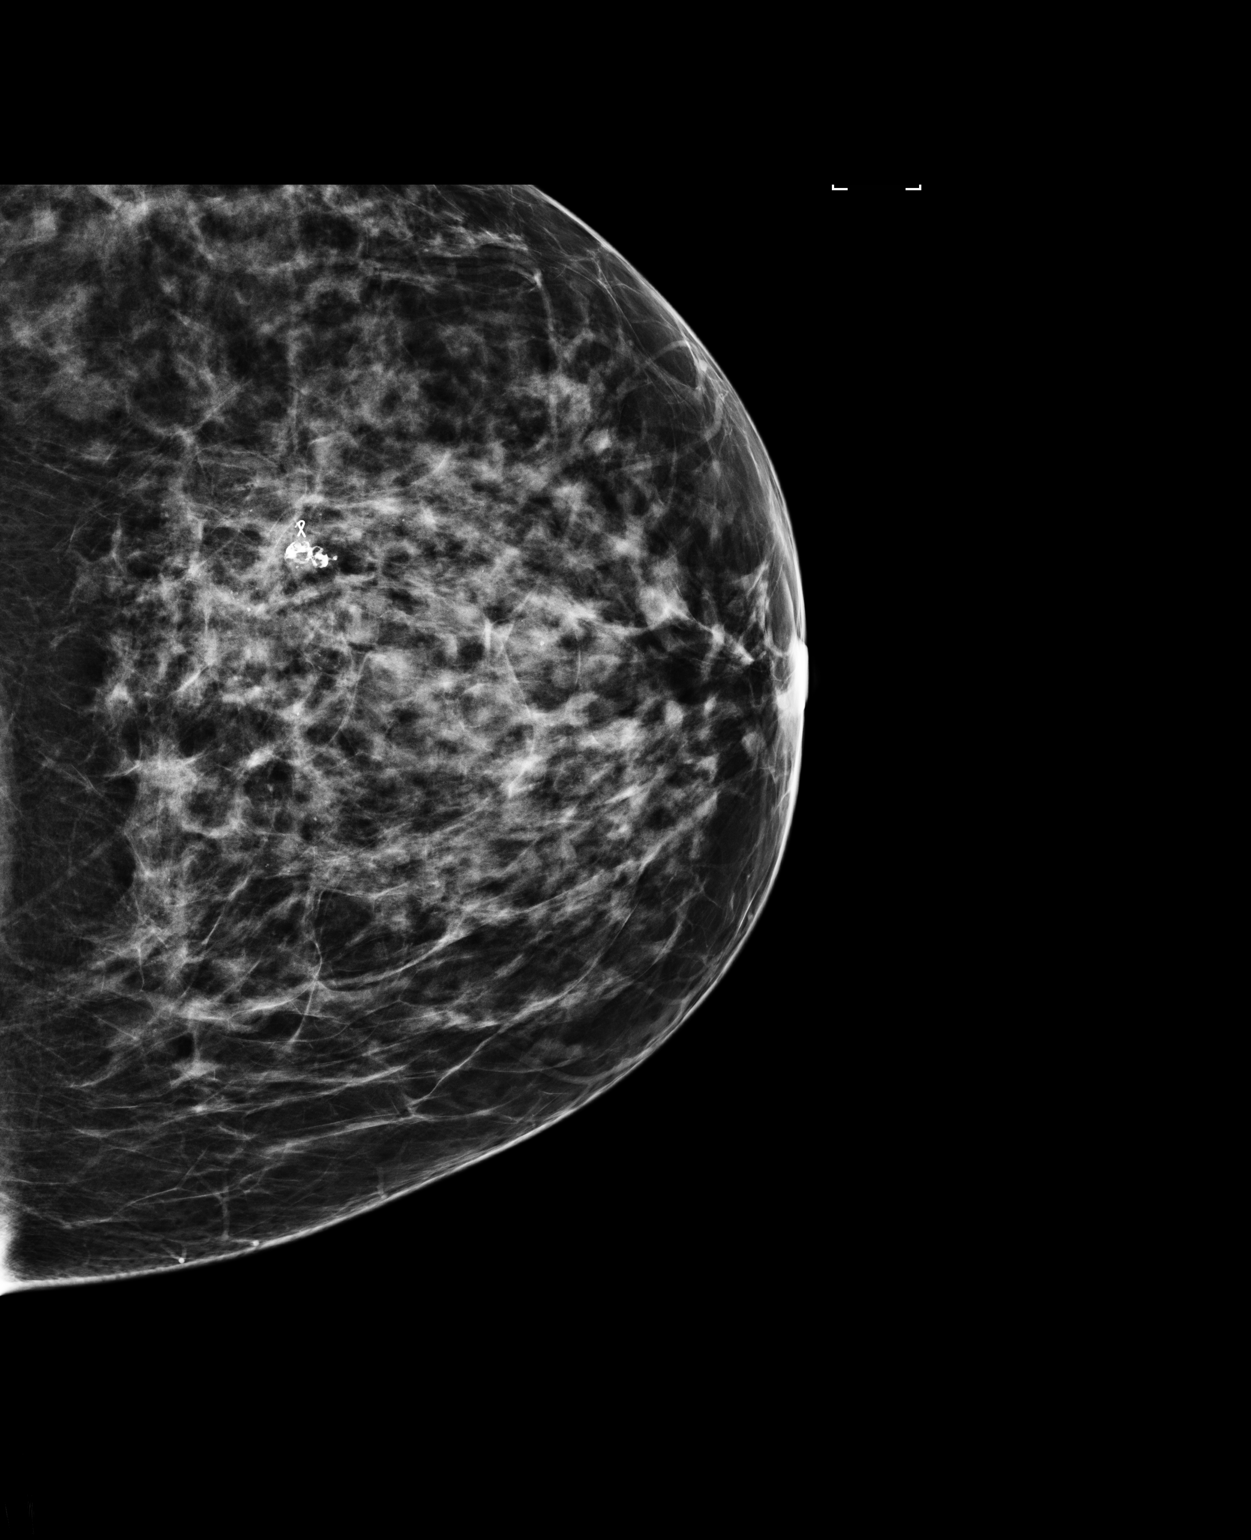

[2 of 2 positions shown; findings below may reference images not displayed]

FINDINGS: Mammographic images were obtained following ultrasound guided biopsy
of a left breast mass at 12 o'clock 5 cm from the nipple. The ribbon
shaped clip is in appropriate location. It appears that the
sonographically identified mass correlates with the calcified mass
seen mammographically.
IMPRESSION: The ribbon shaped clip is in good position.

Final Assessment: Post Procedure Mammograms for Marker Placement

## 2020-07-30 IMAGING — MG US BREAST BX W LOC DEV 1ST LESION IMG BX SPEC US GUIDE*L*
1 series · 8 of 8 positions shown · non-contrast
Comparison: Previous exam(s).

Addendum:
CLINICAL DATA: Ultrasound-guided biopsy of a 12 o'clock left breast
mass.

EXAM:
ULTRASOUND GUIDED LEFT BREAST CORE NEEDLE BIOPSY

[Series 1: MG view · 0.06mm/px · 8 of 30 slices shown]
[im 1/30]
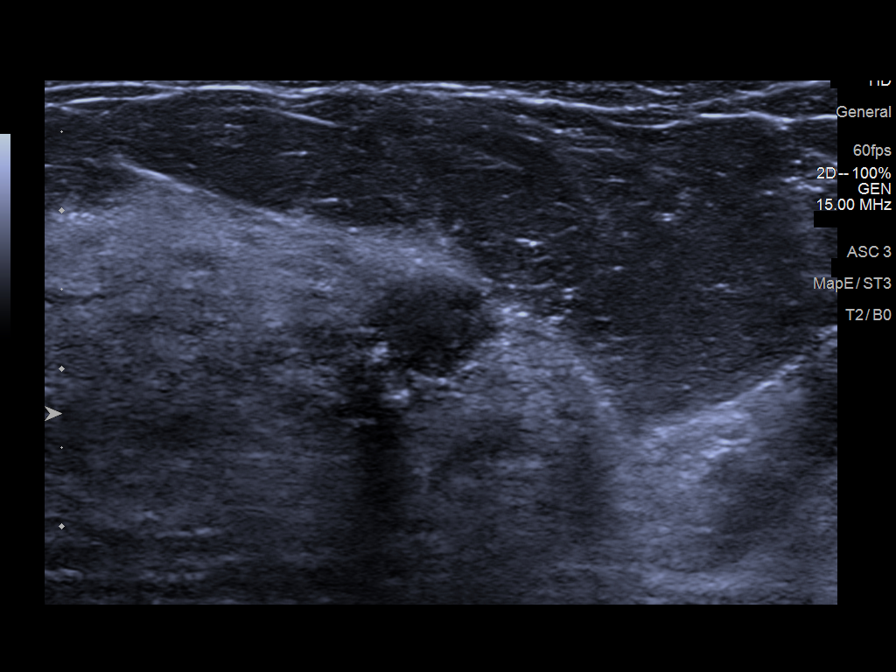
[im 5/30]
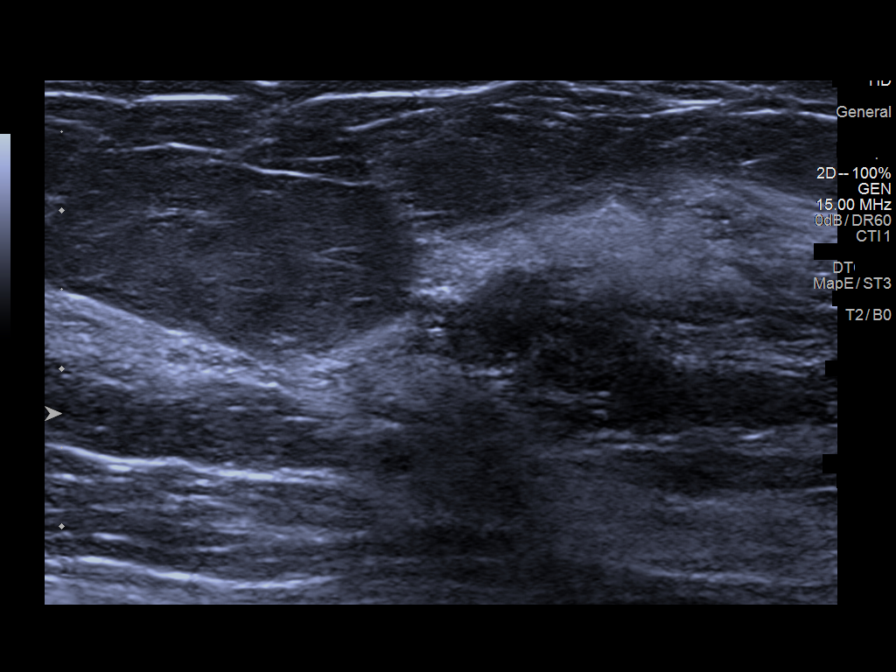
[im 9/30]
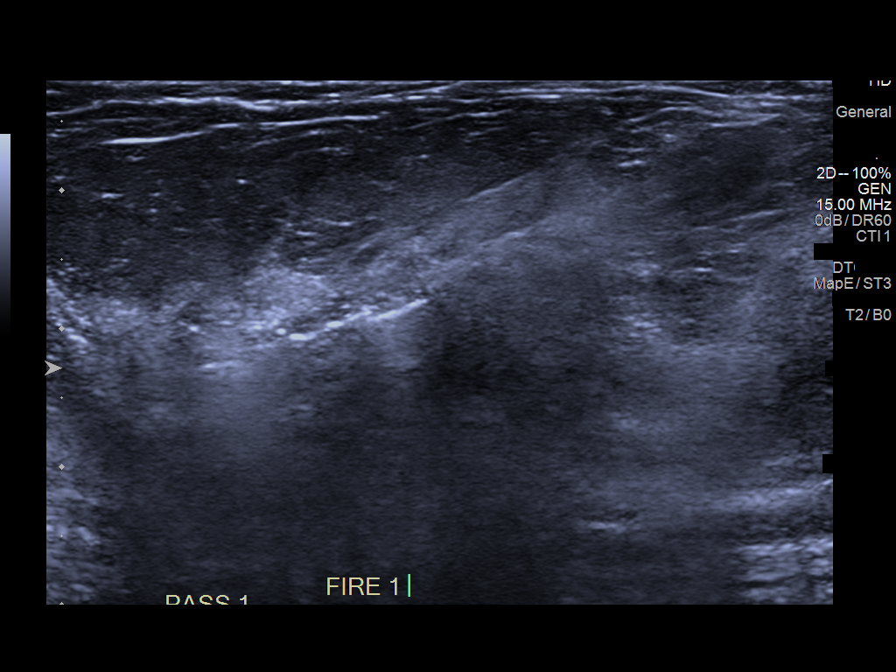
[im 13/30]
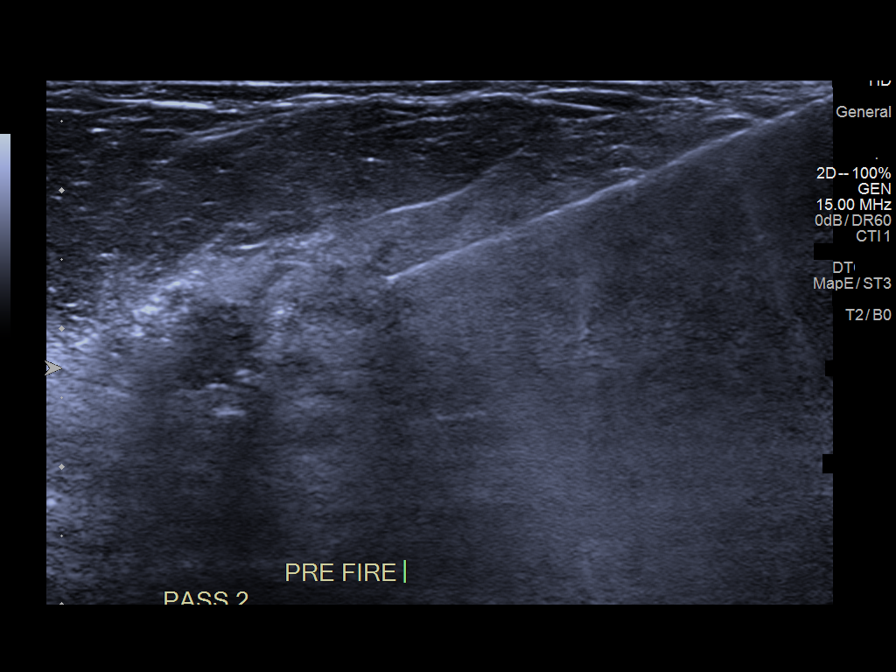
[im 17/30]
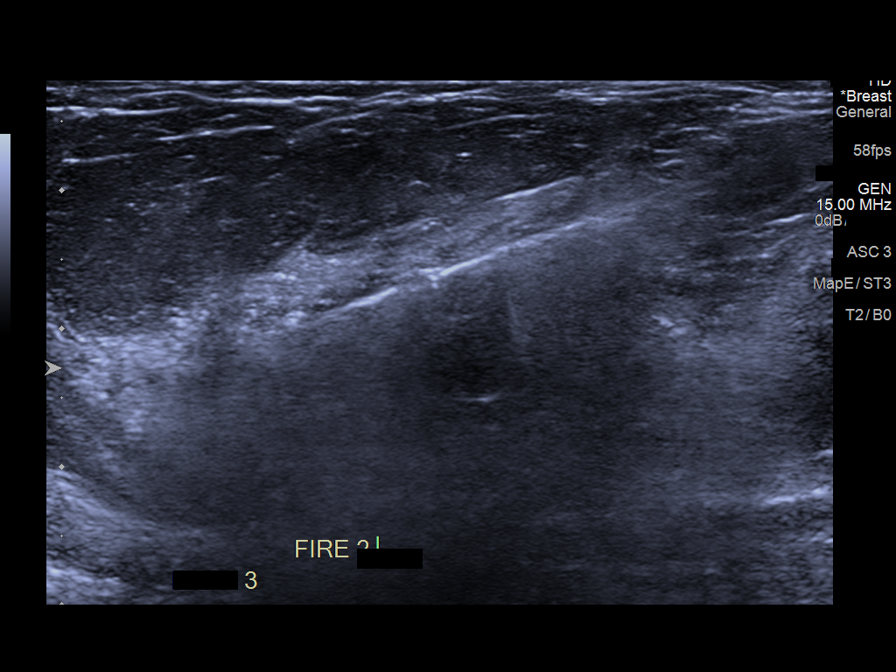
[im 21/30]
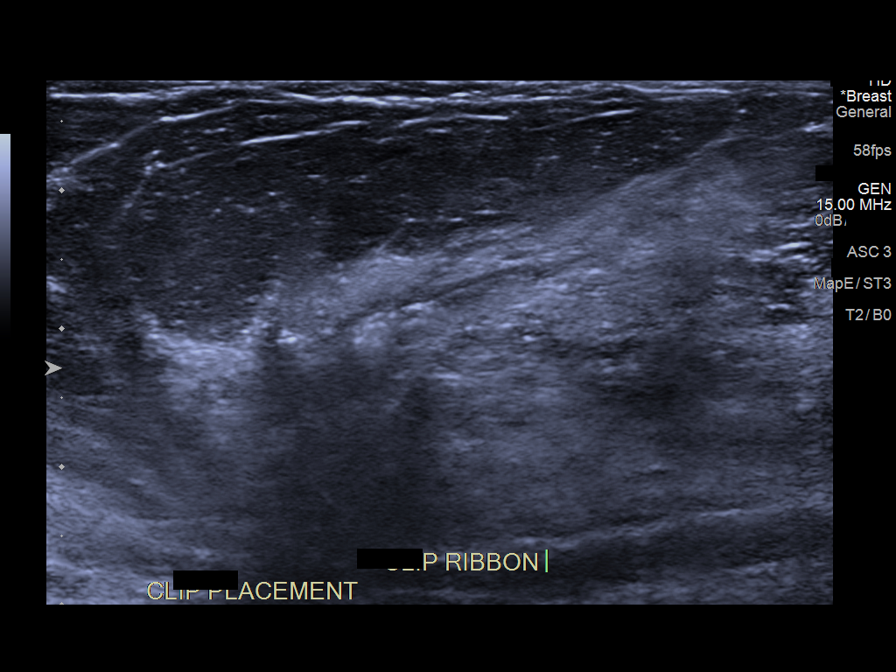
[im 25/30]
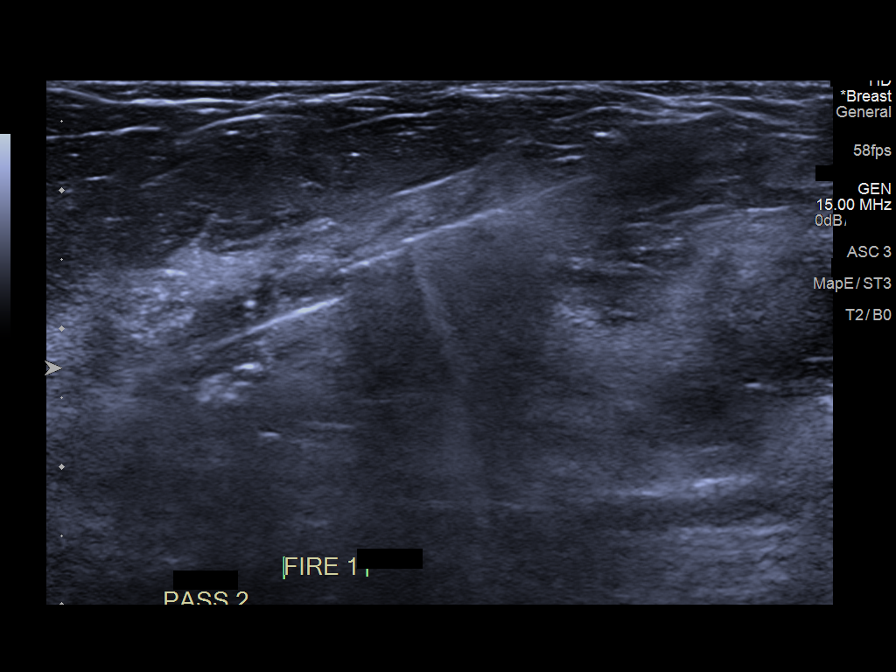
[im 30/30]
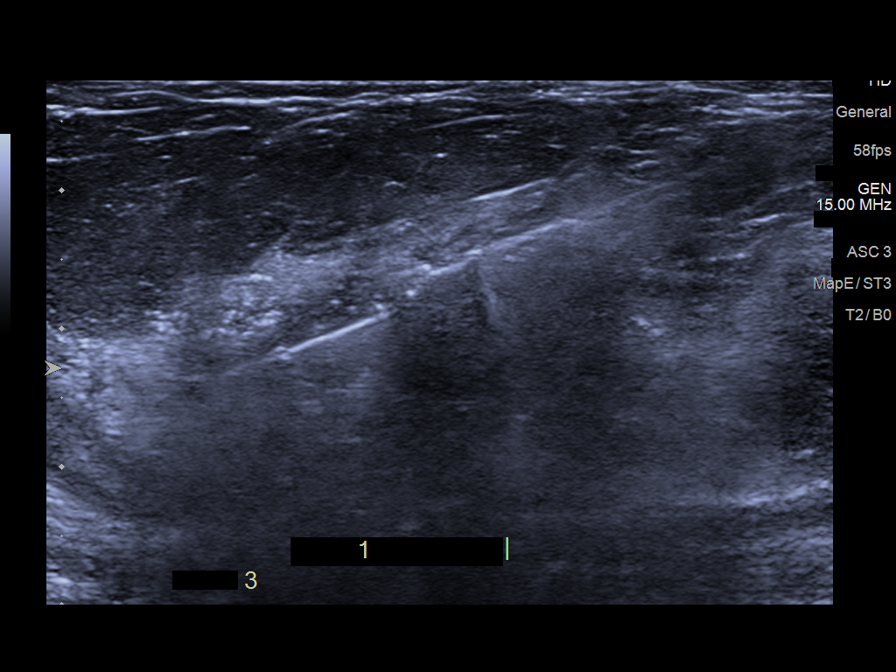

[8 of 8 positions shown; findings below may reference images not displayed]



Lesion quadrant: Upper-outer

Using sterile technique and 1% Lidocaine as local anesthetic, under
direct ultrasound visualization, a 12 gauge Jim device was
used to perform biopsy of a left breast mass at 12 o'clock using a
medial approach. At the conclusion of the procedure a ribbon shaped
tissue marker clip was deployed into the biopsy cavity. Follow up 2
view mammogram was performed and dictated separately.
IMPRESSION: Ultrasound guided biopsy of a left breast mass at 12 o'clock. No
apparent complications.

ADDENDUM:
Pathology of the left breast biopsy revealed A. BREAST MASS, LEFT
[DATE] 5 CM FN; ULTRASOUND-GUIDED BIOPSY: BENIGN BREAST TISSUE WITH
PSEUDO-ANGIOMATOUS STROMAL HYPERPLASIA, APOCRINE METAPLASIA, USUAL
DUCTAL HYPERPLASIA, SCLEROSING ADENOSIS, AND PARTIAL SAMPLING OF
FIBROADENOMATOUS CHANGE, SEE COMMENT. NEGATIVE FOR ATYPIA AND
MALIGNANCY. Comment: The above findings may represent a complex
fibroadenoma. Correlation with radiographic findings and clinical
impression is required.

This was found to be concordant by Dr. Tomo.

Recommendation: Six month follow-up mammogram and ultrasound of the
left breast.

At the patient's request, results and recommendations were relayed
to the patient by phone by Good, Dominick on 06/21/2018. The
patient stated she did well following the biopsy with no bleeding or
pain. Post biopsy instructions were reviewed with the patient and
all of her questions were answered. She was encouraged to contact
the [HOSPITAL] with any further questions or concerns.

Addendum by Good, Dominick on 06/21/2018.

*** End of Addendum ***

## 2020-08-05 ENCOUNTER — Telehealth: Payer: Self-pay

## 2020-08-05 NOTE — Telephone Encounter (Signed)
Copied from [place] [phone]. Topic: Complaint - Billing/Coding >> Aug 05, 2020 10:05 AM Celene Kras wrote: DOS: 03/14/20 Details of complaint: Pt states that this visit was coded wrong. She states that it was supposed to be coded as a physical.  How would the patient like to see this issue resolved?Pt is requesting to have bill re-coded correctly. Please advise.    Route to Engineer, building services.

## 2020-08-28 NOTE — Telephone Encounter (Signed)
Sent in update physical code to the [facility] billing account representative in order to reprocess lab claim on 08/07/2020 via email documentation.  Please allow thirty days for reprocessing.

## 2020-09-05 ENCOUNTER — Telehealth: Payer: Self-pay | Admitting: Family Medicine

## 2020-09-05 NOTE — Telephone Encounter (Signed)
Copied from [place] [id number]. Topic: General - Other >> Sep 05, 2020  2:05 PM Leward Quan A wrote: Reason for CRM: Patient called in  asking to speak to Office Manager stated that this is in reference to the $60 charge for the office visit that she was charged due to Delsa Grana not wanting to see her again for a physical. Patient states this charge was supposed to have been taken care of but now its in collections. Please call  Ph# [phone]

## 2020-09-09 NOTE — Telephone Encounter (Signed)
Spoke with patient and apologize that she has not received a resolution to her billing dispute and that I would forward this to the director for our practice.  Patient verbalize understanding and stated she would await another response.

## 2021-03-17 ENCOUNTER — Ambulatory Visit (INDEPENDENT_AMBULATORY_CARE_PROVIDER_SITE_OTHER): Payer: BC Managed Care – PPO | Admitting: Family Medicine

## 2021-03-17 ENCOUNTER — Encounter: Payer: Self-pay | Admitting: Family Medicine

## 2021-03-17 ENCOUNTER — Other Ambulatory Visit: Payer: Self-pay

## 2021-03-17 VITALS — BP 122/70 | HR 113 | Temp 99.5°F | Resp 18 | Ht 62.0 in | Wt 149.5 lb

## 2021-03-17 DIAGNOSIS — Z0000 Encounter for general adult medical examination without abnormal findings: Secondary | ICD-10-CM | POA: Diagnosis not present

## 2021-03-17 DIAGNOSIS — Z1231 Encounter for screening mammogram for malignant neoplasm of breast: Secondary | ICD-10-CM | POA: Diagnosis not present

## 2021-03-17 DIAGNOSIS — Z1159 Encounter for screening for other viral diseases: Secondary | ICD-10-CM | POA: Diagnosis not present

## 2021-03-17 DIAGNOSIS — E785 Hyperlipidemia, unspecified: Secondary | ICD-10-CM

## 2021-03-17 DIAGNOSIS — E663 Overweight: Secondary | ICD-10-CM | POA: Insufficient documentation

## 2021-03-17 DIAGNOSIS — Z1211 Encounter for screening for malignant neoplasm of colon: Secondary | ICD-10-CM

## 2021-03-17 DIAGNOSIS — Z114 Encounter for screening for human immunodeficiency virus [HIV]: Secondary | ICD-10-CM

## 2021-03-17 NOTE — Progress Notes (Signed)
Patient: Lori Gray, Female    DOB: March 17, 1966, 55 y.o.   MRN: [id number] Delsa Grana, PA-C Visit Date: 03/17/2021  Today's Provider: Delsa Grana, PA-C   Chief Complaint  Patient presents with   Annual Exam   Chills    99.5 is actual temperature, pt states she may have a fever and chills are the only sx she experiencing at the moment.   Subjective:   Annual physical exam:  Lori Gray is a 55 y.o. female who presents today for complete physical exam:  Exercise/Activity:  minimally active 5d a week- 10 min of activity reported - but in exam she states she walks long distances all day for work Diet/nutrition:  eats Geneticist, molecular on sale Sleep:  sleeps well  SDOH Screenings   Alcohol Screen: Low Risk    Last Alcohol Screening Score (AUDIT): 1  Depression (PHQ2-9): Low Risk    PHQ-2 Score: 0  Financial Resource Strain: Low Risk    Difficulty of Paying Living Expenses: Not hard at all  Food Insecurity: No Food Insecurity   Worried About Charity fundraiser in the Last Year: Never true   [place] in the Last Year: Never true  Housing: Not on file  Physical Activity: Insufficiently Active   Days of Exercise per Week: 5 days   Minutes of Exercise per Session: 10 min  Social Connections: Socially Isolated   Frequency of Communication with Friends and Family: Once a week   Frequency of Social Gatherings with Friends and Family: Once a week   Attends Religious Services: Never   Marine scientist or Organizations: No   Attends Music therapist: Never   Marital Status: Married  Stress: No Stress Concern Present   Feeling of Stress : Not at all  Tobacco Use: Low Risk    Smoking Tobacco Use: Never   Smokeless Tobacco Use: Never   Passive Exposure: Not on file  Transportation Needs: No Transportation Needs   Lack of Transportation (Medical): No   Lack of Transportation (Non-Medical): No     USPSTF grade A and B recommendations - reviewed and  addressed today  Depression:  Phq 9 completed today by patient, was reviewed by me with patient in the room PHQ score is neg, pt feels good PHQ 2/9 Scores 03/17/2021 03/14/2020 01/08/2019 10/17/2018  PHQ - 2 Score 0 0 4 0  PHQ- 9 Score 0 - 11 0   Depression screen [facility] 2/9 03/17/2021 03/14/2020 01/08/2019 10/17/2018 09/01/2018  Decreased Interest 0 0 2 0 0  Down, Depressed, Hopeless 0 0 2 0 0  PHQ - 2 Score 0 0 4 0 0  Altered sleeping 0 - 3 0 0  Tired, decreased energy 0 - 2 0 0  Change in appetite 0 - 0 0 0  Feeling bad or failure about yourself  0 - 2 0 0  Trouble concentrating 0 - 0 0 0  Moving slowly or fidgety/restless 0 - 0 0 0  Suicidal thoughts 0 - 0 0 0  PHQ-9 Score 0 - 11 0 0  Difficult doing work/chores Not difficult at all - Somewhat difficult Not difficult at all Not difficult at all    Alcohol screening: [place] Office Visit from 03/17/2021 in [facility]  AUDIT-C Score 1       Immunizations and Health Maintenance: Health Maintenance  Topic Date Due   Pneumococcal Vaccine 32-73 Years old (1 - PCV) Never done   Hepatitis  C Screening  Never done   Zoster Vaccines- Shingrix (1 of 2) Never done   MAMMOGRAM  11/17/2018   COVID-19 Vaccine (1) 04/02/2021 (Originally 06/12/1966)   INFLUENZA VACCINE  08/14/2021 (Originally 12/15/2020)   COLONOSCOPY (Pts 45-5yr Insurance coverage will need to be confirmed)  03/17/2022 (Originally 12/10/2012)   PAP SMEAR-Modifier  07/07/2021   TETANUS/TDAP  05/18/2023   HIV Screening  Completed   HPV VACCINES  Aged Out     Hep C Screening: done today  STD testing and prevention (HIV/chl/gon/syphilis):  see above, no additional testing desired by pt today - HIV done  Intimate partner violence:  Sexual History/Pain during Intercourse: Married  Menstrual History/LMP/Abnormal Bleeding:  GYN thinks  No LMP recorded. Patient has had an ablation.  Incontinence Symptoms:  none  Breast cancer: due for mammogram  ordered Last Mammogram: *see HM list above BRCA gene screening:unknown  Cervical cancer screening: utd with gyn Pt no family hx of cancers - breast, ovarian, uterine, colon:     Osteoporosis:   Discussion on osteoporosis per age, including high calcium and vitamin D supplementation, weight bearing exercises Pt is not supplementing with daily calcium/Vit D.  Skin cancer:  Hx of skin CA -  NO Discussed atypical lesions   Colorectal cancer:   Colonoscopy is due Discussed concerning signs and sx of CRC, pt denies   Lung cancer:   Low Dose CT Chest recommended if Age 53-80years, 20 pack-year currently smoking OR have quit w/in 15years. Patient does not qualify.    Social History   Tobacco Use   Smoking status: Never   Smokeless tobacco: Never  Vaping Use   Vaping Use: Never used  Substance Use Topics   Alcohol use: Yes    Alcohol/week: 5.0 standard drinks    Types: 5 Glasses of wine per week   Drug use: No     Flowsheet Row [facility] 1       Family History  Problem Relation Age of Onset   COPD Mother    Cancer Mother        lung   Depression Mother    Arthritis Mother        rheumatoid   Arthritis Father    Cancer Father        unknown   Diabetes Father    Heart disease Father        CABG x 4   Hyperlipidemia Father    Hypertension Father    Kidney disease Father    Stroke Father 880  Vision loss Father    Macular degeneration Father        dry   Cancer Brother      Blood pressure/Hypertension: BP Readings from Last 3 Encounters:  03/17/21 122/70  03/14/20 122/84  01/25/19 121/85    Weight/Obesity: Wt Readings from Last 3 Encounters:  03/17/21 149 lb 8 oz (67.8 kg)  03/14/20 135 lb 6.4 oz (61.4 kg)  01/25/19 132 lb (59.9 kg)   BMI Readings from Last 3 Encounters:  03/17/21 27.34 kg/m  03/14/20 24.76 kg/m  01/25/19 24.14 kg/m     Lipids:  Lab Results  Component Value  Date   CHOL 249 (H) 03/14/2020   CHOL 213 (H) 07/07/2018   CHOL 213 (H) 06/17/2017   Lab Results  Component Value Date   HDL 98 03/14/2020   HDL 74 07/07/2018   HDL 79 06/17/2017   Lab Results  Component Value Date   LDLCALC 136 (H) 03/14/2020   LDLCALC 125 (H) 07/07/2018   LDLCALC 117 (H) 06/17/2017   Lab Results  Component Value Date   TRIG 62 03/14/2020   TRIG 51 07/07/2018   TRIG 77 06/17/2017   Lab Results  Component Value Date   CHOLHDL 2.5 03/14/2020   CHOLHDL 2.9 07/07/2018   CHOLHDL 2.7 06/17/2017   No results found for: LDLDIRECT Based on the results of lipid panel his/her cardiovascular risk factor ( using [facility] )  in the next 10 years is: The 10-year ASCVD risk score (Arnett DK, et al., 2019) is: 1%   Values used to calculate the score:     Age: 15 years     Sex: Female     Is Non-Hispanic African American: No     Diabetic: No     Tobacco smoker: No     Systolic Blood Pressure: 517 mmHg     Is BP treated: No     HDL Cholesterol: 98 mg/dL     Total Cholesterol: 249 mg/dL  Glucose:  Glucose, Bld  Date Value Ref Range Status  03/14/2020 89 65 - 99 mg/dL Final    Comment:    .            Fasting reference interval .   07/07/2018 86 65 - 99 mg/dL Final    Comment:    .            Fasting reference interval .   06/17/2017 84 65 - 139 mg/dL Final    Comment:    .        Non-fasting reference interval .     Advanced Care Planning:  A voluntary discussion about advance care planning including the explanation and discussion of advance directives.   Discussed health care proxy and Living will, and the patient was able to identify a health care proxy as her husband.   Patient does not have a living will at present time.   Social History       Social History   Socioeconomic History   Marital status: Married    Spouse name: Herbie [place]   Number of children: 1   Years of education: 12   Highest education level: Not on file  Occupational  History    Comment: Biochemist, clinical  Tobacco Use   Smoking status: Never   Smokeless tobacco: Never  Vaping Use   Vaping Use: Never used  Substance and Sexual Activity   Alcohol use: Yes    Alcohol/week: 5.0 standard drinks    Types: 5 Glasses of wine per week   Drug use: No   Sexual activity: Yes    Partners: Male    Birth control/protection: Surgical    Comment: Ablation   Other Topics Concern   Not on file  Social History Narrative   Not on file   Social Determinants of Health   Financial Resource Strain: Low Risk    Difficulty of Paying Living Expenses: Not hard at all  Food Insecurity: No Food Insecurity   Worried About Charity fundraiser in the Last Year: Never true   [place] in the Last Year: Never true  Transportation Needs: No Transportation Needs   Lack of Transportation (Medical): No   Lack of Transportation (Non-Medical): No  Physical Activity: Insufficiently Active   Days of Exercise per Week: 5 days   Minutes of Exercise per Session: 10 min  Stress: No Stress  Concern Present   Feeling of Stress : Not at all  Social Connections: Socially Isolated   Frequency of Communication with Friends and Family: Once a week   Frequency of Social Gatherings with Friends and Family: Once a week   Attends Religious Services: Never   Marine scientist or Organizations: No   Attends Music therapist: Never   Marital Status: Married    Family History        Family History  Problem Relation Age of Onset   COPD Mother    Cancer Mother        lung   Depression Mother    Arthritis Mother        rheumatoid   Arthritis Father    Cancer Father        unknown   Diabetes Father    Heart disease Father        CABG x 4   Hyperlipidemia Father    Hypertension Father    Kidney disease Father    Stroke Father 1   Vision loss Father    Macular degeneration Father        dry   Cancer Brother     Patient Active Problem List   Diagnosis  Date Noted   Hyperlipidemia 03/14/2020   Breast fibroadenoma, left 03/14/2020   Osteoporosis 03/14/2020   Arthralgia of both hands 11/08/2018   Seropositive rheumatoid arthritis ([place]) 11/08/2018   Vitamin D deficiency 07/07/2018   Osteopenia 06/17/2017    Past Surgical History:  Procedure Laterality Date   ABLATION     BREAST BIOPSY Left    benign   BREAST BIOPSY Left 06/20/2018   neg, ribbon clip    TUBAL LIGATION       Current Outpatient Medications:    methotrexate (RHEUMATREX) 2.5 MG tablet, , Disp: , Rfl:    Cholecalciferol (VITAMIN D-1000 MAX ST) 25 MCG (1000 UT) tablet, Take by mouth. (Patient not taking: Reported on 03/17/2021), Disp: , Rfl:    fluticasone (FLONASE) 50 MCG/ACT nasal spray, Place 2 sprays into both nostrils daily. (Patient not taking: Reported on 03/17/2021), Disp: 16 g, Rfl: 2   folic acid (FOLVITE) 1 MG tablet, , Disp: , Rfl:   No Known Allergies  Patient Care Team: Delsa Grana, PA-C as PCP - General (Family Medicine)   Chart Review: I personally reviewed active problem list, medication list, allergies, family history, social history, health maintenance, notes from last encounter, lab results, imaging with the patient/caregiver today.   Review of Systems  Constitutional: Negative.   HENT: Negative.    Eyes: Negative.   Respiratory: Negative.    Cardiovascular: Negative.   Gastrointestinal: Negative.   Endocrine: Negative.   Genitourinary: Negative.   Musculoskeletal: Negative.   Skin: Negative.   Allergic/Immunologic: Negative.   Neurological: Negative.   Hematological: Negative.   Psychiatric/Behavioral: Negative.    All other systems reviewed and are negative.        Objective:   Vitals:  Vitals:   03/17/21 0822  BP: 122/70  Pulse: (!) 113  Resp: 18  Temp: 99.5 F (37.5 C)  TempSrc: Oral  SpO2: 97%  Weight: 149 lb 8 oz (67.8 kg)  Height: '5\' 2"'  (1.575 m)    Body mass index is 27.34 kg/m.  Physical Exam Vitals and  nursing note reviewed.  Constitutional:      General: She is not in acute distress.    Appearance: Normal appearance. She is well-developed, well-groomed and overweight. She is  not ill-appearing, toxic-appearing or diaphoretic.     Interventions: Face mask in place.  HENT:     Head: Normocephalic and atraumatic.     Right Ear: External ear normal.     Left Ear: External ear normal.     Nose: Nose normal.     Mouth/Throat:     Mouth: Mucous membranes are moist.     Pharynx: Oropharynx is clear. No oropharyngeal exudate or posterior oropharyngeal erythema.  Eyes:     General: Lids are normal. No scleral icterus.       Right eye: No discharge.        Left eye: No discharge.     Conjunctiva/sclera: Conjunctivae normal.     Pupils: Pupils are equal, round, and reactive to light.  Neck:     Trachea: Phonation normal. No tracheal deviation.  Cardiovascular:     Rate and Rhythm: Normal rate and regular rhythm.     Pulses: Normal pulses.          Radial pulses are 2+ on the right side and 2+ on the left side.       Posterior tibial pulses are 2+ on the right side and 2+ on the left side.     Heart sounds: Normal heart sounds. No murmur heard.   No friction rub. No gallop.     Comments: HR 96-100 at time of exam Pulmonary:     Effort: Pulmonary effort is normal. No respiratory distress.     Breath sounds: Normal breath sounds. No stridor. No wheezing, rhonchi or rales.  Chest:     Chest wall: No tenderness.  Abdominal:     General: Bowel sounds are normal. There is no distension.     Palpations: Abdomen is soft.     Tenderness: There is no abdominal tenderness. There is no guarding.  Musculoskeletal:     Cervical back: Normal range of motion.     Right lower leg: No edema.     Left lower leg: No edema.  Lymphadenopathy:     Cervical: No cervical adenopathy.  Skin:    General: Skin is warm and dry.     Coloration: Skin is not jaundiced or pale.     Findings: No rash.   Neurological:     Mental Status: She is alert.     Motor: No abnormal muscle tone.     Gait: Gait normal.  Psychiatric:        Mood and Affect: Mood normal.        Speech: Speech normal.        Behavior: Behavior normal. Behavior is cooperative.      Fall Risk: Fall Risk  03/17/2021 03/14/2020 01/08/2019 10/17/2018 09/01/2018  Falls in the past year? 0 0 0 0 0  Number falls in past yr: 0 0 0 - 0  Injury with Fall? 0 0 0 - 0  Risk for fall due to : No Fall Risks - - - -  Follow up Falls prevention discussed Falls evaluation completed - - -    Functional Status Survey: Is the patient deaf or have difficulty hearing?: No Does the patient have difficulty seeing, even when wearing glasses/contacts?: No Does the patient have difficulty concentrating, remembering, or making decisions?: No Does the patient have difficulty walking or climbing stairs?: No Does the patient have difficulty dressing or bathing?: No Does the patient have difficulty doing errands alone such as visiting a doctor's office or shopping?: No   Assessment & Plan:  CPE completed today  USPSTF grade A and B recommendations reviewed with patient; age-appropriate recommendations, preventive care, screening tests, etc discussed and encouraged; healthy living encouraged; see AVS for patient education given to patient  Discussed importance of 150 minutes of physical activity weekly, AHA exercise recommendations given to pt in AVS/handout  Discussed importance of healthy diet:  eating lean meats and proteins, avoiding trans fats and saturated fats, avoid simple sugars and excessive carbs in diet, eat 6 servings of fruit/vegetables daily and drink plenty of water and avoid sweet beverages.    Recommended pt to do annual eye exam and routine dental exams/cleanings  Depression, alcohol, fall screening completed as documented above and per flowsheets  Advance Care planning information and packet discussed and offered  today, encouraged pt to discuss with family members/spouse/partner/friends and complete Advanced directive packet and bring copy to office   Reviewed Health Maintenance: Health Maintenance  Topic Date Due   Pneumococcal Vaccine 31-7 Years old (1 - PCV) Never done   Hepatitis C Screening  Never done   Zoster Vaccines- Shingrix (1 of 2) Never done   MAMMOGRAM  11/17/2018   COVID-19 Vaccine (1) 04/02/2021 (Originally 06/12/1966)   INFLUENZA VACCINE  08/14/2021 (Originally 12/15/2020)   COLONOSCOPY (Pts 45-74yr Insurance coverage will need to be confirmed)  03/17/2022 (Originally 12/10/2012)   PAP SMEAR-Modifier  07/07/2021   TETANUS/TDAP  05/18/2023   HIV Screening  Completed   HPV VACCINES  Aged Out    Immunizations:  refuses all  There is no immunization history on file for this patient. Vaccines:  HPV: up to at age 53, ask insurance if age between 211-45 Shingrix: 575-64yo and ask insurance if covered when patient above 61yo Pneumonia:  educated and discussed with patient. Flu:  educated and discussed with patient    ICD-10-CM   1. Adult general medical exam  Z00.00 CBC w/Diff/Platelet    COMPLETE METABOLIC PANEL WITH GFR    Lipid panel    2. Screening for HIV without presence of risk factors  Z11.4 HIV antibody (with reflex)    3. Encounter for hepatitis C screening test for low risk patient  Z11.59 Hepatitis C Antibody    4. Encounter for screening mammogram for malignant neoplasm of breast  Z12.31 MM 3D SCREEN BREAST BILATERAL    5. Screening for malignant neoplasm of colon  Z12.11 Fecal Globin By Immunochemistry   reviewed options with her - will try to complete FIT test    6. Overweight  E66.3       Encouraged to do CRC screening options, consider vaccinations for coverage for cold/flu winter season, schedule mammogram - info given    LDelsa Grana PA-C 03/17/21 8:54 AM  [facility]

## 2021-03-17 NOTE — Patient Instructions (Addendum)
Health Maintenance  Topic Date Due   Mammogram  11/17/2018   COVID-19 Vaccine (1) 04/02/2021*   Zoster (Shingles) Vaccine (1 of 2) 06/17/2021*   Flu Shot  08/14/2021*   Pneumococcal Vaccination (1 - PCV) 03/17/2022*   Colon Cancer Screening  03/17/2022*   Pap Smear  07/07/2021   Tetanus Vaccine  05/18/2023   Hepatitis C Screening: USPSTF Recommendation to screen - Ages 18-53 yo.  Completed   HIV Screening  Completed   HPV Vaccine  Aged Out  *Topic was postponed. The date shown is not the original due date.    [facility] [place],  [place]  [address] Get Driving Directions Main: [phone]    Preventive Care 53-30 Years Old, Female Preventive care refers to lifestyle choices and visits with your health care provider that can promote health and wellness. This includes: A yearly physical exam. This is also called an annual wellness visit. Regular dental and eye exams. Immunizations. Screening for certain conditions. Healthy lifestyle choices, such as: Eating a healthy diet. Getting regular exercise. Not using drugs or products that contain nicotine and tobacco. Limiting alcohol use. What can I expect for my preventive care visit? Physical exam Your health care provider will check your: Height and weight. These may be used to calculate your BMI (body mass index). BMI is a measurement that tells if you are at a healthy weight. Heart rate and blood pressure. Body temperature. Skin for abnormal spots. Counseling Your health care provider may ask you questions about your: Past medical problems. Family's medical history. Alcohol, tobacco, and drug use. Emotional well-being. Home life and relationship well-being. Sexual activity. Diet, exercise, and sleep habits. Work and work Statistician. Access to firearms. Method of birth control. Menstrual cycle. Pregnancy history. What immunizations do I need? Vaccines are  usually given at various ages, according to a schedule. Your health care provider will recommend vaccines for you based on your age, medical history, and lifestyle or other factors, such as travel or where you work. What tests do I need? Blood tests Lipid and cholesterol levels. These may be checked every 5 years, or more often if you are over 35 years old. Hepatitis C test. Hepatitis B test. Screening Lung cancer screening. You may have this screening every year starting at age 40 if you have a 30-pack-year history of smoking and currently smoke or have quit within the past 15 years. Colorectal cancer screening. All adults should have this screening starting at age 75 and continuing until age 59. Your health care provider may recommend screening at age 14 if you are at increased risk. You will have tests every 1-10 years, depending on your results and the type of screening test. Diabetes screening. This is done by checking your blood sugar (glucose) after you have not eaten for a while (fasting). You may have this done every 1-3 years. Mammogram. This may be done every 1-2 years. Talk with your health care provider about when you should start having regular mammograms. This may depend on whether you have a family history of breast cancer. BRCA-related cancer screening. This may be done if you have a family history of breast, ovarian, tubal, or peritoneal cancers. Pelvic exam and Pap test. This may be done every 3 years starting at age 35. Starting at age 73, this may be done every 5 years if you have a Pap test in combination with an HPV test. Other tests STD (sexually transmitted disease) testing,  if you are at risk. Bone density scan. This is done to screen for osteoporosis. You may have this scan if you are at high risk for osteoporosis. Talk with your health care provider about your test results, treatment options, and if necessary, the need for more tests. Follow these instructions at  home: Eating and drinking  Eat a diet that includes fresh fruits and vegetables, whole grains, lean protein, and low-fat dairy products. Take vitamin and mineral supplements as recommended by your health care provider. Do not drink alcohol if: Your health care provider tells you not to drink. You are pregnant, may be pregnant, or are planning to become pregnant. If you drink alcohol: Limit how much you have to 0-1 drink a day. Be aware of how much alcohol is in your drink. In the U.S., one drink equals one 12 oz bottle of beer (355 mL), one 5 oz glass of wine (148 mL), or one 1 oz glass of hard liquor (44 mL). Lifestyle Take daily care of your teeth and gums. Brush your teeth every morning and night with fluoride toothpaste. Floss one time each day. Stay active. Exercise for at least 30 minutes 5 or more days each week. Do not use any products that contain nicotine or tobacco, such as cigarettes, e-cigarettes, and chewing tobacco. If you need help quitting, ask your health care provider. Do not use drugs. If you are sexually active, practice safe sex. Use a condom or other form of protection to prevent STIs (sexually transmitted infections). If you do not wish to become pregnant, use a form of birth control. If you plan to become pregnant, see your health care provider for a prepregnancy visit. If told by your health care provider, take low-dose aspirin daily starting at age 48. Find healthy ways to cope with stress, such as: Meditation, yoga, or listening to music. Journaling. Talking to a trusted person. Spending time with friends and family. Safety Always wear your seat belt while driving or riding in a vehicle. Do not drive: If you have been drinking alcohol. Do not ride with someone who has been drinking. When you are tired or distracted. While texting. Wear a helmet and other protective equipment during sports activities. If you have firearms in your house, make sure you follow  all gun safety procedures. What's next? Visit your health care provider once a year for an annual wellness visit. Ask your health care provider how often you should have your eyes and teeth checked. Stay up to date on all vaccines. This information is not intended to replace advice given to you by your health care provider. Make sure you discuss any questions you have with your health care provider. Document Revised: 07/11/2020 Document Reviewed: 01/12/2018 Elsevier Patient Education  2022 [place].  Preventing Osteoporosis, Adult Osteoporosis is a condition that causes the bones to lose density. This means that the bones become thinner, and the normal spaces in bone tissue become larger. Low bone density can make the bones weak and cause them to break more easily. Osteoporosis cannot always be prevented, but you can take steps to lower your risk of developing this condition. How can this condition affect me? If you develop osteoporosis, you will be more likely to break bones in your wrist, spine, or hip. Even a minor accident or injury can be enough to break weak bones. The bones will also be slower to heal. Osteoporosis can cause other problems as well, such as a stooped posture or trouble with movement.  Osteoporosis can occur with aging. As you get older, you may lose bone tissue more quickly, or it may be replaced more slowly. Osteoporosis is more likely to develop if you have poor nutrition or do not get enough calcium or vitamin D. Other lifestyle factors can also play a role. By eating a well-balanced diet and making lifestyle changes, you can help keep your bones strong and healthy, lowering your chances of developing osteoporosis. What can increase my risk? The following factors may make you more likely to develop osteoporosis: Having a family history of the condition. Having poor nutrition or not getting enough calcium or vitamin D. Using certain medicines, such as steroid medicines  or anti-seizure medicines. Being any of the following: 68 years of age or older. Female. A woman who has gone through menopause (is postmenopausal). A person who is of European or Asian descent. Using products that contain nicotine or tobacco, such as cigarettes, e-cigarettes, and chewing tobacco. Not being physically active (being sedentary). Having a small body frame. What actions can I take to prevent this? Get enough calcium  Make sure you get enough calcium every day. Calcium is the most important mineral for bone health. Most people can get enough calcium from their diet, but supplements may be recommended for people who are at risk for osteoporosis. Follow these guidelines: If you are age 27 or younger, aim to get 1,000 milligrams (mg) of calcium every day. If you are older than age 73, aim to get 1,200 mg of calcium every day. Good sources of calcium include: Dairy products, such as low-fat or nonfat milk, cheese, and yogurt. Dark green leafy vegetables, such as bok choy and broccoli. Foods that have had calcium added to them (calcium-fortified foods), such as orange juice, cereal, bread, soy beverages, and tofu products. Nuts, such as almonds. Check nutrition labels to see how much calcium is in a food or drink. Get enough vitamin D Try to get enough vitamin D every day. Vitamin D is the most essential vitamin for bone health. It helps the body absorb calcium. Follow these guidelines for how much vitamin D to get from food: If you are age 26 or younger, aim to get at least 600 international units (IU) every day. Your health care provider may suggest more. If you are older than age 77, aim to get at least 800 international units every day. Your health care provider may suggest more. Good sources of vitamin D in your diet include: Egg yolks. Oily fish, such as salmon, sardines, and tuna. Milk and cereal fortified with vitamin D. Your body also makes vitamin D when you are out in  the sun. Exposing the bare skin on your face, arms, legs, or back to the sun for no more than 30 minutes a day, 2 times a week is more than enough. Beyond that, make sure you use sunblock to protect your skin from sunburn, which increases your risk for skin cancer. Exercise  Stay active and get exercise every day. Ask your health care provider what types of exercise are best for you. Weight-bearing and strength-building activities are important for building and maintaining healthy bones. Some examples of these types of activities include: Walking and hiking. Jogging and running. Dancing. Gym exercises and lifting weights. Tennis and racquetball. Climbing stairs. Tai chi. Make other lifestyle changes Do not use any products that contain nicotine or tobacco, such as cigarettes, e-cigarettes, and chewing tobacco. If you need help quitting, ask your health care provider. Lose weight  if you are overweight. If you drink alcohol: Limit how much you use to: 0-1 drink a day for women who are not pregnant. 0-2 drinks a day for men. Be aware of how much alcohol is in your drink. In the U.S., one drink equals one 12 oz bottle of beer (355 mL), one 5 oz glass of wine (148 mL), or one 1 oz glass of hard liquor (44 mL). Where to find support If you need help making changes to prevent osteoporosis, talk with your health care provider. You can ask for a referral to a dietitian and a physical therapist. Where to find more information Learn more about osteoporosis from: NIH Osteoporosis and Related [place]: www.bones.SouthExposed.es U.S. Office on [employer] Health: [place]Signs.tn [place]: EquipmentWeekly.com.ee Summary Osteoporosis is a condition that causes weak bones that are more likely to break. Eat a healthy diet, making sure you get enough calcium and vitamin D, and stay active by getting regular exercise to help prevent osteoporosis. Other ways to reduce  your risk of osteoporosis include maintaining a healthy weight and avoiding alcohol and products that contain nicotine or tobacco. This information is not intended to replace advice given to you by your health care provider. Make sure you discuss any questions you have with your health care provider. Document Revised: 10/18/2019 Document Reviewed: 10/18/2019 Elsevier Patient Education  [place].

## 2021-03-18 LAB — CBC WITH DIFFERENTIAL/PLATELET
Absolute Monocytes: 475 cells/uL (ref 200–950)
Basophils Absolute: 31 cells/uL (ref 0–200)
Basophils Relative: 0.7 %
Eosinophils Absolute: 31 cells/uL (ref 15–500)
Eosinophils Relative: 0.7 %
HCT: 41.1 % (ref 35.0–45.0)
Hemoglobin: 13.9 g/dL (ref 11.7–15.5)
Lymphs Abs: 656 cells/uL — ABNORMAL LOW (ref 850–3900)
MCH: 31.3 pg (ref 27.0–33.0)
MCHC: 33.8 g/dL (ref 32.0–36.0)
MCV: 92.6 fL (ref 80.0–100.0)
MPV: 10.4 fL (ref 7.5–12.5)
Monocytes Relative: 10.8 %
Neutro Abs: 3208 cells/uL (ref 1500–7800)
Neutrophils Relative %: 72.9 %
Platelets: 216 10*3/uL (ref 140–400)
RBC: 4.44 10*6/uL (ref 3.80–5.10)
RDW: 12.6 % (ref 11.0–15.0)
Total Lymphocyte: 14.9 %
WBC: 4.4 10*3/uL (ref 3.8–10.8)

## 2021-03-18 LAB — COMPLETE METABOLIC PANEL WITH GFR
AG Ratio: 1.7 (calc) (ref 1.0–2.5)
ALT: 16 U/L (ref 6–29)
AST: 24 U/L (ref 10–35)
Albumin: 4.5 g/dL (ref 3.6–5.1)
Alkaline phosphatase (APISO): 91 U/L (ref 37–153)
BUN: 8 mg/dL (ref 7–25)
CO2: 24 mmol/L (ref 20–32)
Calcium: 9.2 mg/dL (ref 8.6–10.4)
Chloride: 101 mmol/L (ref 98–110)
Creat: 0.64 mg/dL (ref 0.50–1.03)
Globulin: 2.7 g/dL (calc) (ref 1.9–3.7)
Glucose, Bld: 82 mg/dL (ref 65–99)
Potassium: 4.5 mmol/L (ref 3.5–5.3)
Sodium: 137 mmol/L (ref 135–146)
Total Bilirubin: 0.3 mg/dL (ref 0.2–1.2)
Total Protein: 7.2 g/dL (ref 6.1–8.1)
eGFR: 106 mL/min/{1.73_m2} (ref >=60)

## 2021-03-18 LAB — LIPID PANEL
Cholesterol: 180 mg/dL (ref <200)
HDL: 76 mg/dL (ref >=50)
LDL Cholesterol (Calc): 90 mg/dL (calc)
Non-HDL Cholesterol (Calc): 104 mg/dL (calc) (ref <130)
Total CHOL/HDL Ratio: 2.4 (calc) (ref <5.0)
Triglycerides: 49 mg/dL (ref <150)

## 2021-03-18 LAB — HIV ANTIBODY (ROUTINE TESTING W REFLEX): HIV 1&2 Ab, 4th Generation: NONREACTIVE

## 2021-03-18 LAB — HEPATITIS C ANTIBODY
Hepatitis C Ab: NONREACTIVE
SIGNAL TO CUT-OFF: 0.12 (ref <1.00)

## 2021-05-22 ENCOUNTER — Telehealth: Payer: Self-pay

## 2021-05-22 NOTE — Telephone Encounter (Signed)
Copied from [name] [id number]. Topic: General - Other >> May 22, 2021 11:46 AM Lori Gray A wrote: Reason for CRM: The patient has been in contact with their Insurer and told that their labs from their physical on 04/03/21 were coded incorrectly   Please contact further when possible

## 2021-05-29 NOTE — Telephone Encounter (Signed)
Pt states she will bring it next week

## 2021-07-03 ENCOUNTER — Encounter: Payer: Self-pay | Admitting: Family Medicine

## 2021-11-06 ENCOUNTER — Ambulatory Visit: Payer: Self-pay | Admitting: *Deleted

## 2021-11-09 ENCOUNTER — Encounter: Payer: Self-pay | Admitting: Internal Medicine

## 2021-11-09 ENCOUNTER — Telehealth: Payer: Self-pay | Admitting: *Deleted

## 2021-11-09 ENCOUNTER — Ambulatory Visit: Payer: BC Managed Care – PPO | Admitting: Internal Medicine

## 2021-11-09 VITALS — BP 124/76 | HR 99 | Temp 97.8°F | Resp 16 | Ht 62.0 in | Wt 151.0 lb

## 2021-11-09 DIAGNOSIS — F419 Anxiety disorder, unspecified: Secondary | ICD-10-CM

## 2021-11-09 MED ORDER — HYDROXYZINE HCL 10 MG PO TABS: 10.0000 mg | ORAL_TABLET | Freq: Three times a day (TID) | ORAL | 1 refills | Status: DC | PRN

## 2021-11-16 ENCOUNTER — Ambulatory Visit: Payer: BC Managed Care – PPO | Admitting: *Deleted

## 2021-11-16 DIAGNOSIS — F419 Anxiety disorder, unspecified: Secondary | ICD-10-CM

## 2021-11-16 NOTE — Patient Instructions (Signed)
Visit Information  Thank you for taking time to visit with me today. Please don't hesitate to contact me if I can be of assistance to you before our next scheduled telephone appointment.  Following are the goals we discussed today:  - begin personal counseling - practice relaxation or meditation daily - talk about feelings with a friend, family or spiritual advisor - practice positive thinking and self-talk  Our next appointment is by telephone on 11/30/21 at 1pm  Please call the care guide team at [phone] if you need to cancel or reschedule your appointment.   If you are experiencing a Mental Health or [place] or need someone to talk to, please call the Suicide and Crisis Lifeline: 3   Following is a copy of your full plan of care:  Care Plan : General Social Work (Adult)  Updates made by [employer], Baruch Gouty M, LCSW since 11/16/2021 12:00 AM     Problem: CHL AMB "PATIENT-SPECIFIC PROBLEM"   Priority: Medium  Onset Date: 11/16/2021  Note:   CARE PLAN ENTRY (see longitudinal plan of care for additional care plan information)  Current Barriers:  Knowledge deficits related to accessing mental health provider in patient with Anxiety  Patient is experiencing symptoms of  anxiety which seem to be exacerbated by family and employment challenges, patient describes chest tightening,fatigue, difficulty concentrating, difficulty sleeping  Patient needs Support, Education, and Care Coordination in order to meet unmet mental health needs  Mental Health Concerns   Clinical Social Work Goal(s):  Over the next 90 days, patient will work with SW bi-weekly by telephone or in person to reduce or manage symptoms of anxiety until connected for ongoing counseling resources.  Patient will implement clinical interventions discussed today to decreases symptoms of anxiety and increase knowledge and/or ability of: coping skills.  Interventions:  Assessed patient's understanding, education,  previous treatment and care coordination needs  Patient interviewed and appropriate assessments performed: PHQ 2 PHQ 9 Provided basic mental health support, education and interventions, positive coping strategies discussed Collaborated with appropriate clinical care team members regarding patient needs Discussed options for long term counseling based on need and insurance. Provided list of providers to contact for possible assistance including the [facility] [phone], [facility] [phone], [facility] [phone], [facility] [phone] Reviewed mental health medications with patient prescribed by PCP and discussed compliance  Other interventions include: PHQ2/ PHQ9 completed Active listening / Reflection utilized  Emotional Support Provided Participation in counseling encouraged  Verbalization of feelings encouraged    Patient Self Care Activities & Deficits:  Patient is unable to independently navigate community resource options without care coordination support Patient is able to implement clinical interventions discussed today and is motivated for treatment  Patient will select one of the agencies from the list provided and call to schedule an appointment  Performs ADL's independently Performs IADL's independently Independent living  Initial goal documentation      Ms. Pickney was given information about Care Management services by the embedded care coordination team including:  Care Management services include personalized support from designated clinical staff supervised by her physician, including individualized plan of care and coordination with other care providers 24/7 contact phone numbers for assistance for urgent and routine care needs. The patient may stop CCM services at any time (effective at the end of the month) by phone call to the office staff.  Patient agreed to services and verbal consent  obtained.   Patient verbalizes understanding of instructions  and care plan provided today and agrees to view in [place]. Active MyChart status and patient understanding of how to access instructions and care plan via MyChart confirmed with patient.     Telephone follow up appointment with care management team member scheduled for: 11/30/21  Elliot Gurney, [place] Worker  [facility] [phone]

## 2021-11-16 NOTE — Chronic Care Management (AMB) (Signed)
Care Management Clinical Social Work Note  11/16/2021 Name: Lori Gray MRN: [id number] DOB: 03-12-1966  Lori Gray is a 56 y.o. year old female who is a primary care patient of Lori Gray, [place].  The Care Management team was consulted for assistance with chronic disease management and coordination needs.  Engaged with patient by telephone for follow up visit in response to provider referral for social work chronic care management and care coordination services  Consent to Services:  Lori Gray was given information about Care Management services today including:  Care Management services includes personalized support from designated clinical staff supervised by her physician, including individualized plan of care and coordination with other care providers 24/7 contact phone numbers for assistance for urgent and routine care needs. The patient may stop case management services at any time by phone call to the office staff.  Patient agreed to services and consent obtained.   Assessment: Review of patient past medical history, allergies, medications, and health status, including review of relevant consultants reports was performed today as part of a comprehensive evaluation and provision of chronic care management and care coordination services.  SDOH (Social Determinants of Health) assessments and interventions performed:    Advanced Directives Status: Not addressed in this encounter.  Care Plan  No Known Allergies  Outpatient Encounter Medications as of 11/16/2021  Medication Sig   folic acid (FOLVITE) 1 MG tablet Take 1 mg by mouth daily.   hydrOXYzine (ATARAX) 10 MG tablet Take 1 tablet (10 mg total) by mouth 3 (three) times daily as needed for anxiety.   methotrexate (RHEUMATREX) 2.5 MG tablet    No facility-administered encounter medications on file as of 11/16/2021.    Patient Active Problem List   Diagnosis Date Noted   Overweight 03/17/2021   Hyperlipidemia  03/14/2020   Seropositive rheumatoid arthritis ([place]) 11/08/2018   Vitamin D deficiency 07/07/2018   Osteopenia 06/17/2017    Conditions to be addressed/monitored: Anxiety; Mental Health Concerns   Care Plan : General Social Work (Adult)  Updates made by Lori Claude, LCSW since 11/16/2021 12:00 AM     Problem: CHL AMB "PATIENT-SPECIFIC PROBLEM"   Priority: Medium  Onset Date: 11/16/2021  Note:   CARE PLAN ENTRY (see longitudinal plan of care for additional care plan information)  Current Barriers:  Knowledge deficits related to accessing mental health provider in patient with Anxiety  Patient is experiencing symptoms of  anxiety which seem to be exacerbated by family and employment challenges, patient describes chest tightening,fatigue, difficulty concentrating, difficulty sleeping  Patient needs Support, Education, and Care Coordination in order to meet unmet mental health needs  Mental Health Concerns   Clinical Social Work Goal(s):  Over the next 90 days, patient will work with SW bi-weekly by telephone or in person to reduce or manage symptoms of anxiety until connected for ongoing counseling resources.  Patient will implement clinical interventions discussed today to decreases symptoms of anxiety and increase knowledge and/or ability of: coping skills.  Interventions:  Assessed patient's understanding, education, previous treatment and care coordination needs  Patient interviewed and appropriate assessments performed: PHQ 2 PHQ 9 Provided basic mental health support, education and interventions, positive coping strategies discussed Collaborated with appropriate clinical care team members regarding patient needs Discussed options for long term counseling based on need and insurance. Provided list of providers to contact for possible assistance including the [facility] [phone], [facility] [phone], [facility] [phone],  [place] [phone] Reviewed mental health  medications with patient prescribed by PCP and discussed compliance  Other interventions include: PHQ2/ PHQ9 completed Active listening / Reflection utilized  Emotional Support Provided Participation in counseling encouraged  Verbalization of feelings encouraged    Patient Self Care Activities & Deficits:  Patient is unable to independently navigate community resource options without care coordination support Patient is able to implement clinical interventions discussed today and is motivated for treatment  Patient will select one of the agencies from the list provided and call to schedule an appointment  Performs ADL's independently Performs IADL's independently Independent living  Initial goal documentation       Follow Up Plan: SW will follow up with patient by phone over the next 14 business days  [place], [place] Worker  [facility] Care [facility] [phone]

## 2021-11-30 ENCOUNTER — Telehealth: Payer: BC Managed Care – PPO

## 2021-11-30 ENCOUNTER — Telehealth: Payer: Self-pay | Admitting: *Deleted

## 2021-11-30 NOTE — Telephone Encounter (Signed)
  Care Management   Follow Up Note   11/30/2021 Name: Lori Gray MRN: [id number] DOB: [birth year]   Referred by: Delsa Grana, PA-C Reason for referral : Care Coordination   An unsuccessful telephone outreach was attempted today. The patient was referred to the case management team for assistance with care management and care coordination.   Follow Up Plan: Telephone follow up appointment with care management team member to be re-scheduled by careguide.  Elliot Gurney, [place] Administrator, [facility] [phone]

## 2021-12-11 ENCOUNTER — Ambulatory Visit: Payer: BC Managed Care – PPO | Admitting: Family Medicine

## 2021-12-16 ENCOUNTER — Telehealth: Payer: Self-pay | Admitting: *Deleted

## 2021-12-16 NOTE — Chronic Care Management (AMB) (Signed)
  Care Coordination Note  12/16/2021 Name: Lori Gray MRN: [id number] DOB: Jun 28, 1965  Lori Gray is a 56 y.o. year old female who is a primary care patient of Laurell Roof and is actively engaged with the care management team. I reached out to [facility] by phone today to assist with re-scheduling an initial visit with the Licensed Clinical Social Worker  Follow up plan: We have been unable to make contact with the patient for follow up. The care management team is available to follow up with the patient after provider conversation with the patient regarding recommendation for care management engagement and subsequent re-referral to the care management team.   Julian Hy, [place] Direct Dial: [phone]

## 2022-01-21 ENCOUNTER — Telehealth: Payer: Self-pay | Admitting: Family Medicine

## 2022-01-21 NOTE — Telephone Encounter (Signed)
Pt is calling to speak Lori Gray regarding about bill. Company put the wrong termination date. Pts is wanting to speak to [facility]. CB- [phone]

## 2022-02-12 ENCOUNTER — Telehealth: Payer: Self-pay | Admitting: Family Medicine

## 2022-02-12 NOTE — Telephone Encounter (Signed)
Medication Refill - Medication: valACYclovir (VALTREX) 1000 MG tablet  Has the patient contacted their pharmacy? NO (Agent: If no, request that the patient contact the pharmacy for the refill. If patient does not wish to contact the pharmacy document the reason why and proceed with request.) (Agent: If yes, when and what did the pharmacy advise?)  Preferred Pharmacy (with phone number or street name):  [facility] Phone:  [phone]  Fax:  [phone]     Has the patient been seen for an appointment in the last year OR does the patient have an upcoming appointment? Yes.    Agent: Please be advised that RX refills may take up to 3 business days. We ask that you follow-up with your pharmacy.

## 2022-02-15 NOTE — Telephone Encounter (Signed)
Pt needs an appointment. Thank you.

## 2022-02-15 NOTE — Telephone Encounter (Signed)
LM for pt to call back and schedule an appt for new meds

## 2022-02-16 ENCOUNTER — Telehealth: Payer: 59 | Admitting: Family Medicine

## 2022-02-16 ENCOUNTER — Encounter: Payer: Self-pay | Admitting: Family Medicine

## 2022-02-16 DIAGNOSIS — F419 Anxiety disorder, unspecified: Secondary | ICD-10-CM

## 2022-02-16 DIAGNOSIS — A6000 Herpesviral infection of urogenital system, unspecified: Secondary | ICD-10-CM | POA: Diagnosis not present

## 2022-02-16 MED ORDER — VALACYCLOVIR HCL 1 G PO TABS: 1000.0000 mg | ORAL_TABLET | Freq: Every day | ORAL | 3 refills | Status: AC

## 2022-02-16 MED ORDER — HYDROXYZINE HCL 10 MG PO TABS: 10.0000 mg | ORAL_TABLET | Freq: Three times a day (TID) | ORAL | 5 refills | Status: AC | PRN

## 2022-02-16 NOTE — Progress Notes (Signed)
Name: Lori Gray   MRN: [id number]    DOB: 1965-05-19   Date:02/16/2022       Progress Note  Subjective:    Chief Complaint  Chief Complaint  Patient presents with   Medication Refill    valACYclovir (VALTREX) 1000 MG tablet for Herpes, pt states nothing visible    I connected with  Rennie Natter  on 02/16/22 at  1:20 PM EDT by a video enabled telemedicine application and verified that I am speaking with the correct person using two identifiers.  I discussed the limitations of evaluation and management by telemedicine and the availability of in person appointments. The patient expressed understanding and agreed to proceed. Staff also discussed with the patient that there may be a patient responsible charge related to this service. Patient Location:  home Provider Location: [facility] Additional Individuals present:   Medication Refill   Med refill request - She was using valtrex for when she had herpes outbreaks, lately with increased stress she has been having sx almost monthly and she asks about suppressive dosing of meds Also she did find hydroxyzine helpful for anxiety and insomnia - prescribed by Dr. Rosana Berger and she would like a refill  She is coming in for CPE next month  She has new insurance which needs to be put on file - she will come by the office    Patient Active Problem List   Diagnosis Date Noted   Overweight 03/17/2021   Hyperlipidemia 03/14/2020   Seropositive rheumatoid arthritis ([place]) 11/08/2018   Vitamin D deficiency 07/07/2018   Osteopenia 06/17/2017    Social History   Tobacco Use   Smoking status: Never   Smokeless tobacco: Never  Substance Use Topics   Alcohol use: Yes    Alcohol/week: 5.0 standard drinks of alcohol    Types: 5 Glasses of wine per week     Current Outpatient Medications:    folic acid (FOLVITE) 1 MG tablet, Take 1 mg by mouth daily., Disp: , Rfl:    hydrOXYzine (ATARAX) 10 MG tablet, Take 1 tablet (10 mg total) by  mouth 3 (three) times daily as needed for anxiety., Disp: 30 tablet, Rfl: 1   tiZANidine (ZANAFLEX) 2 MG tablet, Take 2 mg by mouth 3 (three) times daily as needed., Disp: , Rfl:    methotrexate (RHEUMATREX) 2.5 MG tablet, , Disp: , Rfl:    valACYclovir (VALTREX) 1000 MG tablet, Take 1 tablet (1,000 mg total) by mouth daily., Disp: 90 tablet, Rfl: 3  No Known Allergies  I personally reviewed active problem list, medication list, allergies, family history, social history, health maintenance, notes from last encounter, lab results, imaging with the patient/caregiver today.   Review of Systems  Constitutional: Negative.   HENT: Negative.    Eyes: Negative.   Respiratory: Negative.    Cardiovascular: Negative.   Gastrointestinal: Negative.   Endocrine: Negative.   Genitourinary: Negative.   Musculoskeletal: Negative.   Skin: Negative.   Allergic/Immunologic: Negative.   Neurological: Negative.   Hematological: Negative.   Psychiatric/Behavioral: Negative.    All other systems reviewed and are negative.     Objective:   Virtual encounter, vitals limited, only able to obtain the following There were no vitals filed for this visit. There is no height or weight on file to calculate BMI. Nursing Note and Vital Signs reviewed.  Physical Exam Vitals and nursing note reviewed.  Constitutional:      General: She is not in acute distress.    Appearance:  She is obese. She is not ill-appearing, toxic-appearing or diaphoretic.     Comments: Well appearing, NAD  Pulmonary:     Effort: No respiratory distress.  Neurological:     Mental Status: She is alert.  Psychiatric:        Mood and Affect: Mood normal.        Behavior: Behavior normal.     PE limited by virtual encounter  No results found for this or any previous visit (from the past 72 hour(s)).  Assessment and Plan:     ICD-10-CM   1. Genital herpes simplex, unspecified site  A60.00 valACYclovir (VALTREX) 1000 MG tablet    outbreaks happening often, change valtrex to suppressive once daily tx 1000 mg po once daily, she can switch back to abortive later if she wants to     2. Anxiety  F41.9 hydrOXYzine (ATARAX) 10 MG tablet   hydroxyzine has been helpful for anxiety sx that prevent her from falling asleep/insomnia, refills ordered, mood good, increased situational stress       - I discussed the assessment and treatment plan with the patient. The patient was provided an opportunity to ask questions and all were answered. The patient agreed with the plan and demonstrated an understanding of the instructions.  I provided 18 minutes of non-face-to-face time during this encounter.  Delsa Grana, PA-C 02/16/22 1:31 PM

## 2022-03-17 NOTE — Patient Instructions (Signed)
Preventive Care 40-54 Years Old, Female Preventive care refers to lifestyle choices and visits with your health care provider that can promote health and wellness. Preventive care visits are also called wellness exams. What can I expect for my preventive care visit? Counseling Your health care provider may ask you questions about your: Medical history, including: Past medical problems. Family medical history. Pregnancy history. Current health, including: Menstrual cycle. Method of birth control. Emotional well-being. Home life and relationship well-being. Sexual activity and sexual health. Lifestyle, including: Alcohol, nicotine or tobacco, and drug use. Access to firearms. Diet, exercise, and sleep habits. Work and work environment. Sunscreen use. Safety issues such as seatbelt and bike helmet use. Physical exam Your health care provider will check your: Height and weight. These may be used to calculate your BMI (body mass index). BMI is a measurement that tells if you are at a healthy weight. Waist circumference. This measures the distance around your waistline. This measurement also tells if you are at a healthy weight and may help predict your risk of certain diseases, such as type 2 diabetes and high blood pressure. Heart rate and blood pressure. Body temperature. Skin for abnormal spots. What immunizations do I need?  Vaccines are usually given at various ages, according to a schedule. Your health care provider will recommend vaccines for you based on your age, medical history, and lifestyle or other factors, such as travel or where you work. What tests do I need? Screening Your health care provider may recommend screening tests for certain conditions. This may include: Lipid and cholesterol levels. Diabetes screening. This is done by checking your blood sugar (glucose) after you have not eaten for a while (fasting). Pelvic exam and Pap test. Hepatitis B test. Hepatitis C  test. HIV (human immunodeficiency virus) test. STI (sexually transmitted infection) testing, if you are at risk. Lung cancer screening. Colorectal cancer screening. Mammogram. Talk with your health care provider about when you should start having regular mammograms. This may depend on whether you have a family history of breast cancer. BRCA-related cancer screening. This may be done if you have a family history of breast, ovarian, tubal, or peritoneal cancers. Bone density scan. This is done to screen for osteoporosis. Talk with your health care provider about your test results, treatment options, and if necessary, the need for more tests. Follow these instructions at home: Eating and drinking  Eat a diet that includes fresh fruits and vegetables, whole grains, lean protein, and low-fat dairy products. Take vitamin and mineral supplements as recommended by your health care provider. Do not drink alcohol if: Your health care provider tells you not to drink. You are pregnant, may be pregnant, or are planning to become pregnant. If you drink alcohol: Limit how much you have to 0-1 drink a day. Know how much alcohol is in your drink. In the [place] one drink equals one 12 oz bottle of beer (355 mL), one 5 oz glass of wine (148 mL), or one 1 oz glass of hard liquor (44 mL). Lifestyle Brush your teeth every morning and night with fluoride toothpaste. Floss one time each day. Exercise for at least 30 minutes 5 or more days each week. Do not use any products that contain nicotine or tobacco. These products include cigarettes, chewing tobacco, and vaping devices, such as e-cigarettes. If you need help quitting, ask your health care provider. Do not use drugs. If you are sexually active, practice safe sex. Use a condom or other form of protection to   prevent STIs. If you do not wish to become pregnant, use a form of birth control. If you plan to become pregnant, see your health care provider for a  prepregnancy visit. Take aspirin only as told by your health care provider. Make sure that you understand how much to take and what form to take. Work with your health care provider to find out whether it is safe and beneficial for you to take aspirin daily. Find healthy ways to manage stress, such as: Meditation, yoga, or listening to music. Journaling. Talking to a trusted person. Spending time with friends and family. Minimize exposure to UV radiation to reduce your risk of skin cancer. Safety Always wear your seat belt while driving or riding in a vehicle. Do not drive: If you have been drinking alcohol. Do not ride with someone who has been drinking. When you are tired or distracted. While texting. If you have been using any mind-altering substances or drugs. Wear a helmet and other protective equipment during sports activities. If you have firearms in your house, make sure you follow all gun safety procedures. Seek help if you have been physically or sexually abused. What's next? Visit your health care provider once a year for an annual wellness visit. Ask your health care provider how often you should have your eyes and teeth checked. Stay up to date on all vaccines. This information is not intended to replace advice given to you by your health care provider. Make sure you discuss any questions you have with your health care provider. Document Revised: 10/29/2020 Document Reviewed: 10/29/2020 [employer] Patient Education  [place].

## 2022-03-19 ENCOUNTER — Other Ambulatory Visit (HOSPITAL_COMMUNITY)
Admission: RE | Admit: 2022-03-19 | Discharge: 2022-03-19 | Disposition: A | Discharge status: Home / Self Care | Payer: 59 | Source: Ambulatory Visit | Attending: Family Medicine | Admitting: Family Medicine

## 2022-03-19 ENCOUNTER — Encounter: Payer: Self-pay | Admitting: Family Medicine

## 2022-03-19 ENCOUNTER — Ambulatory Visit (INDEPENDENT_AMBULATORY_CARE_PROVIDER_SITE_OTHER): Payer: 59 | Admitting: Family Medicine

## 2022-03-19 VITALS — BP 130/76 | HR 98 | Temp 98.2°F | Resp 16 | Ht 62.0 in | Wt 152.5 lb

## 2022-03-19 DIAGNOSIS — Z0000 Encounter for general adult medical examination without abnormal findings: Secondary | ICD-10-CM | POA: Diagnosis present

## 2022-03-19 DIAGNOSIS — Z1231 Encounter for screening mammogram for malignant neoplasm of breast: Secondary | ICD-10-CM | POA: Diagnosis not present

## 2022-03-19 DIAGNOSIS — Z5181 Encounter for therapeutic drug level monitoring: Secondary | ICD-10-CM

## 2022-03-19 DIAGNOSIS — Z124 Encounter for screening for malignant neoplasm of cervix: Secondary | ICD-10-CM | POA: Insufficient documentation

## 2022-03-19 DIAGNOSIS — Z780 Asymptomatic menopausal state: Secondary | ICD-10-CM

## 2022-03-19 DIAGNOSIS — Z1211 Encounter for screening for malignant neoplasm of colon: Secondary | ICD-10-CM | POA: Diagnosis not present

## 2022-03-19 DIAGNOSIS — E559 Vitamin D deficiency, unspecified: Secondary | ICD-10-CM
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
## 2022-03-20 LAB — CBC WITH DIFFERENTIAL/PLATELET
Absolute Monocytes: 820 cells/uL (ref 200–950)
Basophils Absolute: 66 cells/uL (ref 0–200)
Basophils Relative: 0.8 %
Eosinophils Absolute: 131 cells/uL (ref 15–500)
Eosinophils Relative: 1.6 %
HCT: 43.1 % (ref 35.0–45.0)
Hemoglobin: 14.4 g/dL (ref 11.7–15.5)
Lymphs Abs: 2321 cells/uL (ref 850–3900)
MCH: 30.4 pg (ref 27.0–33.0)
MCHC: 33.4 g/dL (ref 32.0–36.0)
MCV: 90.9 fL (ref 80.0–100.0)
MPV: 10.8 fL (ref 7.5–12.5)
Monocytes Relative: 10 %
Neutro Abs: 4863 cells/uL (ref 1500–7800)
Neutrophils Relative %: 59.3 %
Platelets: 290 10*3/uL (ref 140–400)
RBC: 4.74 10*6/uL (ref 3.80–5.10)
RDW: 12.2 % (ref 11.0–15.0)
Total Lymphocyte: 28.3 %
WBC: 8.2 10*3/uL (ref 3.8–10.8)

## 2022-03-20 LAB — VITAMIN D 25 HYDROXY (VIT D DEFICIENCY, FRACTURES): Vit D, 25-Hydroxy: 27 ng/mL — ABNORMAL LOW (ref 30–100)

## 2022-03-20 LAB — COMPLETE METABOLIC PANEL WITH GFR
AG Ratio: 1.5 (calc) (ref 1.0–2.5)
ALT: 12 U/L (ref 6–29)
AST: 17 U/L (ref 10–35)
Albumin: 4.4 g/dL (ref 3.6–5.1)
Alkaline phosphatase (APISO): 106 U/L (ref 37–153)
BUN: 16 mg/dL (ref 7–25)
CO2: 28 mmol/L (ref 20–32)
Calcium: 9.8 mg/dL (ref 8.6–10.4)
Chloride: 103 mmol/L (ref 98–110)
Creat: 0.57 mg/dL (ref 0.50–1.03)
Globulin: 3 g/dL (calc) (ref 1.9–3.7)
Glucose, Bld: 86 mg/dL (ref 65–99)
Potassium: 4.5 mmol/L (ref 3.5–5.3)
Sodium: 141 mmol/L (ref 135–146)
Total Bilirubin: 0.5 mg/dL (ref 0.2–1.2)
Total Protein: 7.4 g/dL (ref 6.1–8.1)
eGFR: 108 mL/min/{1.73_m2} (ref >=60)

## 2022-03-20 LAB — LIPID PANEL
Cholesterol: 209 mg/dL — ABNORMAL HIGH (ref <200)
HDL: 85 mg/dL (ref >=50)
LDL Cholesterol (Calc): 107 mg/dL (calc) — ABNORMAL HIGH
Non-HDL Cholesterol (Calc): 124 mg/dL (calc) (ref <130)
Total CHOL/HDL Ratio: 2.5 (calc) (ref <5.0)
Triglycerides: 78 mg/dL (ref <150)

## 2022-03-20 LAB — TSH: TSH: 1.9 mIU/L

## 2022-03-22 ENCOUNTER — Telehealth: Payer: Self-pay | Admitting: Family Medicine

## 2022-03-22 NOTE — Telephone Encounter (Signed)
Pt is calling to report that she did eat 3 hour prior to her lab work. Perhaps a reason why her labs work was elevated. CB- [phone]

## 2022-03-23 LAB — CYTOLOGY - PAP
Comment: NEGATIVE
Diagnosis: NEGATIVE
High risk HPV: NEGATIVE

## 2022-03-23 NOTE — Telephone Encounter (Signed)
Just an FYI

## 2023-03-08 ENCOUNTER — Ambulatory Visit: Payer: Self-pay

## 2023-03-08 NOTE — Telephone Encounter (Signed)
  Chief Complaint: Very painful hands requesting prednisone RX Symptoms: Above - pt has RA Frequency: 2 weeks Pertinent Negatives: Patient denies  Disposition: [] ED /[] Urgent Care (no appt availability in office) / [x] Appointment(In office/virtual)/ []  [facility]/ [] Home Care/ [] Refused Recommended Disposition /[] [facility]/ []  Follow-up with PCP Additional Notes: Pt has RA. Pt is unable to get into see rheumatology. Pt states that she is having bad pain in her hands and needs prednisone. Appt made for tomorrow morning.    Summary: rx req / hand discomfort   The patient has called to request a prescription for prednisone for their hands  The patient is having an rheumatoid arthritis discomfort in both of their hands for roughly 2 weeks  The patient shares that they have an ongoing history of discomfort and would like to speak further when possible  Please contact when available     Reason for Disposition  [1] MODERATE pain (e.g., interferes with normal activities) AND [2] present > 3 days  Answer Assessment - Initial Assessment Questions 1. ONSET: "When did the pain start?"     Past 2 weeks 2. LOCATION: "Where is the pain located?"     Both hands 3. PAIN: "How bad is the pain?" (Scale 1-10; or mild, moderate, severe)   - MILD (1-3): doesn't interfere with normal activities   - MODERATE (4-7): interferes with normal activities (e.g., work or school) or awakens from sleep   - SEVERE (8-10): excruciating pain, unable to use hand at all     Moderate - severe 4. WORK OR EXERCISE: "Has there been any recent work or exercise that involved this part (i.e., hand or wrist) of the body?"     no 5. CAUSE: "What do you think is causing the pain?"     RA  Protocols used: Hand and Wrist Pain-A-AH

## 2023-03-09 ENCOUNTER — Ambulatory Visit: Payer: 59 | Admitting: Internal Medicine

## 2023-03-09 ENCOUNTER — Encounter: Payer: Self-pay | Admitting: Internal Medicine

## 2023-03-09 ENCOUNTER — Other Ambulatory Visit: Payer: Self-pay | Admitting: Family Medicine

## 2023-03-09 VITALS — BP 116/80 | HR 86 | Temp 98.1°F | Resp 18 | Ht 62.0 in | Wt 139.1 lb

## 2023-03-09 DIAGNOSIS — Z13820 Encounter for screening for osteoporosis: Secondary | ICD-10-CM

## 2023-03-09 DIAGNOSIS — Z0000 Encounter for general adult medical examination without abnormal findings: Secondary | ICD-10-CM

## 2023-03-09 DIAGNOSIS — M069 Rheumatoid arthritis, unspecified: Secondary | ICD-10-CM

## 2023-03-09 DIAGNOSIS — Z1231 Encounter for screening mammogram for malignant neoplasm of breast: Secondary | ICD-10-CM

## 2023-03-09 MED ORDER — PREDNISONE 5 MG PO TABS: ORAL_TABLET | ORAL | 0 refills | Status: AC

## 2023-03-09 NOTE — Progress Notes (Signed)
   Acute Office Visit  Subjective:     Patient ID: Lori Gray, female    DOB: 1966-02-19, 57 y.o.   MRN: [id number]  Chief Complaint  Patient presents with   Arthritis    RA Flare    Arthritis   Patient is in today for RA flare. She was first diagnosed in 2020, had been following with Rheumatology and had been on DMARD therapy but was eventually weaned off. Now she is not on anything for her RA. Today she states that her pain started about 1 week ago in her bilateral hands. Pain is primarily in the MCP's. She is also having swelling. She took Aleve which did improve her symptoms. She called her Rheumatologist but hasn't heard anything back. She denies any other joint pain today.   Review of Systems  Musculoskeletal:  Positive for arthritis and joint pain.  Skin: Negative.         Objective:    BP 116/80   Pulse 86   Temp 98.1 F (36.7 C)   Resp 18   Ht 5\' 2"  (1.575 m)   Wt 139 lb 1.6 oz (63.1 kg)   SpO2 99%   BMI 25.44 kg/m    Physical Exam Constitutional:      Appearance: Normal appearance.  HENT:     Head: Normocephalic and atraumatic.  Eyes:     Conjunctiva/sclera: Conjunctivae normal.  Cardiovascular:     Rate and Rhythm: Normal rate and regular rhythm.  Pulmonary:     Effort: Pulmonary effort is normal.     Breath sounds: Normal breath sounds.  Musculoskeletal:        General: Swelling and tenderness present.     Comments: Appropriate range of motion in bilateral wrists, swelling in all MCP's   Skin:    General: Skin is warm and dry.  Neurological:     General: No focal deficit present.     Mental Status: She is alert.  Psychiatric:        Mood and Affect: Mood normal.        Behavior: Behavior normal.     No results found for any visits on 03/09/23.      Assessment & Plan:   1. Rheumatoid arthritis flare (HCC): Will treat acute symptoms with Prednisone taper. Patient will call Rheumatologist if symptoms reoccur.   - predniSONE  (DELTASONE) 5 MG tablet; Take 1 tablet (5 mg total) by mouth daily with breakfast for 3 days, THEN 0.5 tablets (2.5 mg total) daily with breakfast for 3 days.  Dispense: 4.5 tablet; Refill: 0  2. Osteoporosis screening: Per the patient she has not had a DEXA scan in over 9 years, will order today for osteoporosis screening. Recommend vitamin D supplements and weight bearing exercise.   - DG Bone Density; Future  Return if symptoms worsen or fail to improve.  Margarita Mail, DO

## 2023-03-09 NOTE — Telephone Encounter (Signed)
FYI pt seeing you today

## 2023-03-22 ENCOUNTER — Ambulatory Visit (INDEPENDENT_AMBULATORY_CARE_PROVIDER_SITE_OTHER): Payer: 59 | Admitting: Physician Assistant

## 2023-03-22 ENCOUNTER — Encounter: Payer: Self-pay | Admitting: Physician Assistant

## 2023-03-22 VITALS — BP 144/72 | HR 101 | Temp 97.9°F | Resp 16 | Ht 62.0 in | Wt 140.7 lb

## 2023-03-22 DIAGNOSIS — Z0000 Encounter for general adult medical examination without abnormal findings: Secondary | ICD-10-CM | POA: Diagnosis not present

## 2023-03-22 DIAGNOSIS — E559 Vitamin D deficiency, unspecified: Secondary | ICD-10-CM

## 2023-03-22 DIAGNOSIS — E785 Hyperlipidemia, unspecified: Secondary | ICD-10-CM | POA: Diagnosis not present

## 2023-03-22 NOTE — Patient Instructions (Addendum)
Please let us know if you want to discuss your menopausal symptoms and management. There are several options now that can help with this.   Please follow up in the next 2-3 weeks with your PCP regarding your muscle aches and weakness

## 2023-03-22 NOTE — Progress Notes (Signed)
Annual Physical Exam   Name: Lori Gray   MRN: [id number]    DOB: Apr 30, 1966   Date:03/22/2023  Today's Provider: Jacquelin Hawking, MHS, PA-C Introduced myself to the patient as a PA-C and provided education on APPs in clinical practice.         Subjective  Chief Complaint  Chief Complaint  Patient presents with   Annual Exam    HPI  Patient presents for annual CPE.  Diet: Overall normal dietary intake, she has cut back on potatoes  Exercise: She is not engaged in regular exercise   Sleep:"I'm in my 50s, there is no sleep" she reports having hot flashes at night.  Mood: "reports it is usually good"   Flowsheet Row Office Visit from 03/19/2022 in [facility]  AUDIT-C Score 8      Depression: Phq 9 is  negative    03/22/2023    2:30 PM 03/09/2023    8:14 AM 03/19/2022    1:46 PM 02/16/2022    1:06 PM 11/16/2021    9:26 AM  Depression screen PHQ 2/9  Decreased Interest 0 0 1 0 3  Down, Depressed, Hopeless 0 0 1 0 2  PHQ - 2 Score 0 0 2 0 5  Altered sleeping 0 0 1 0 2  Tired, decreased energy 0 0 1 0 2  Change in appetite 0 0 0 0 1  Feeling bad or failure about yourself  0 0 0 0 0  Trouble concentrating 0 0 0 0 2  Moving slowly or fidgety/restless 0 0 0 0 0  Suicidal thoughts 0 0 0 0   PHQ-9 Score 0 0 4 0 12  Difficult doing work/chores Not difficult at all Not difficult at all Somewhat difficult Not difficult at all    Hypertension: BP Readings from Last 3 Encounters:  03/22/23 (!) 144/72  03/09/23 116/80  03/19/22 130/76   Obesity: Wt Readings from Last 3 Encounters:  03/22/23 140 lb 11.2 oz (63.8 kg)  03/09/23 139 lb 1.6 oz (63.1 kg)  03/19/22 152 lb 8 oz (69.2 kg)   BMI Readings from Last 3 Encounters:  03/22/23 25.73 kg/m  03/09/23 25.44 kg/m  03/19/22 27.89 kg/m      Health Maintenance  Topic Date Due   Mammogram  11/17/2018   COVID-19 Vaccine (1) 03/25/2023*   DTaP/Tdap/Td vaccine (1 - Tdap) 03/21/2024*    Colon Cancer Screening  03/21/2024*   Pap with HPV screening  03/20/2027   Hepatitis C Screening  Completed   HIV Screening  Completed   HPV Vaccine  Aged Out   Flu Shot  Discontinued   Zoster (Shingles) Vaccine  Discontinued  *Topic was postponed. The date shown is not the original due date.     STD testing and prevention (HIV/chl/gon/syphilis): she declines today  Intimate partner violence: negative Sexual History: she is sexually active with single monogamous female partner  Menstrual History/LMP/Abnormal Bleeding: LMP was several years ago - she denies spotting, bleeding since then  Discussed importance of follow up if any post-menopausal bleeding: yes Incontinence Symptoms: No.  Osteoporosis Prevention : Discussed high calcium and vitamin D supplementation, weight bearing exercises Bone density :yes- scheduled   Skin cancer: Discussed monitoring for atypical lesions   Lung cancer:  Low Dose CT Chest recommended if Age 63-80 years, 20 pack-year currently smoking OR have quit w/in 15years. Patient does not qualify.   ECG: NA  Advanced Care Planning: A voluntary discussion  about advance care planning including the explanation and discussion of advance directives.  Discussed health care proxy and Living will, and the patient was able to identify a health care proxy as no one.  Patient does not have a living will in effect.  Lipids: Lab Results  Component Value Date   CHOL 209 (H) 03/19/2022   CHOL 180 03/17/2021   CHOL 249 (H) 03/14/2020   Lab Results  Component Value Date   HDL 85 03/19/2022   HDL 76 03/17/2021   HDL 98 03/14/2020   Lab Results  Component Value Date   LDLCALC 107 (H) 03/19/2022   LDLCALC 90 03/17/2021   LDLCALC 136 (H) 03/14/2020   Lab Results  Component Value Date   TRIG 78 03/19/2022   TRIG 49 03/17/2021   TRIG 62 03/14/2020   Lab Results  Component Value Date   CHOLHDL 2.5 03/19/2022   CHOLHDL 2.4 03/17/2021   CHOLHDL 2.5 03/14/2020   No  results found for: "LDLDIRECT"  Glucose: Glucose, Bld  Date Value Ref Range Status  03/19/2022 86 65 - 99 mg/dL Final    Comment:    .            Fasting reference interval .   03/17/2021 82 65 - 99 mg/dL Final    Comment:    .            Fasting reference interval .   03/14/2020 89 65 - 99 mg/dL Final    Comment:    .            Fasting reference interval .     Patient Active Problem List   Diagnosis Date Noted   Overweight 03/17/2021   Hyperlipidemia 03/14/2020   Seropositive rheumatoid arthritis (HCC) 11/08/2018   Vitamin D deficiency 07/07/2018   Osteopenia 06/17/2017    Past Surgical History:  Procedure Laterality Date   ABLATION     BREAST BIOPSY Left    benign   BREAST BIOPSY Left 06/20/2018   neg, ribbon clip    TUBAL LIGATION      Family History  Problem Relation Age of Onset   COPD Mother    Cancer Mother        lung   Depression Mother    Arthritis Mother        rheumatoid   Arthritis Father    Cancer Father        unknown   Diabetes Father    Heart disease Father        CABG x 4   Hyperlipidemia Father    Hypertension Father    Kidney disease Father    Stroke Father 50   Vision loss Father    Macular degeneration Father        dry   Cancer Brother     Social History   Socioeconomic History   Marital status: Married    Spouse name: Molly Maduro   Number of children: 1   Years of education: 12   Highest education level: Not on file  Occupational History    Comment: Company secretary  Tobacco Use   Smoking status: Never   Smokeless tobacco: Never  Vaping Use   Vaping status: Never Used  Substance and Sexual Activity   Alcohol use: Yes    Alcohol/week: 2.0 standard drinks of alcohol    Types: 2 Glasses of wine per week   Drug use: No   Sexual activity: Yes    Partners: Male  Birth control/protection: Surgical    Comment: Ablation   Other Topics Concern   Not on file  Social History Narrative   Not on file   Social  Determinants of Health   Financial Resource Strain: Patient Declined (03/22/2023)   Overall Financial Resource Strain (CARDIA)    Difficulty of Paying Living Expenses: Patient declined  Food Insecurity: Patient Declined (03/22/2023)   Hunger Vital Sign    Worried About Running Out of Food in the Last Year: Patient declined    Ran Out of Food in the Last Year: Patient declined  Transportation Needs: Patient Declined (03/22/2023)   PRAPARE - Administrator, Civil Service (Medical): Patient declined    Lack of Transportation (Non-Medical): Patient declined  Physical Activity: Patient Declined (03/22/2023)   Exercise Vital Sign    Days of Exercise per Week: Patient declined    Minutes of Exercise per Session: Patient declined  Stress: Patient Declined (03/22/2023)   [employer] of Occupational Health - Occupational Stress Questionnaire    Feeling of Stress : Patient declined  Social Connections: Patient Declined (03/22/2023)   Social Connection and Isolation Panel [NHANES]    Frequency of Communication with Friends and Family: Patient declined    Frequency of Social Gatherings with Friends and Family: Patient declined    Attends Religious Services: Patient declined    Database administrator or Organizations: Patient declined    Attends Banker Meetings: Patient declined    Marital Status: Patient declined  Intimate Partner Violence: Patient Declined (03/22/2023)   Humiliation, Afraid, Rape, and Kick questionnaire    Fear of Current or Ex-Partner: Patient declined    Emotionally Abused: Patient declined    Physically Abused: Patient declined    Sexually Abused: Patient declined     Current Outpatient Medications:    folic acid (FOLVITE) 1 MG tablet, Take 1 mg by mouth daily., Disp: , Rfl:    hydrOXYzine (ATARAX) 10 MG tablet, Take 1 tablet (10 mg total) by mouth 3 (three) times daily as needed for anxiety (insomnia). May take 10-30 mg dose prn bedtime for  insomnia, Disp: 60 tablet, Rfl: 5   methotrexate (RHEUMATREX) 2.5 MG tablet, , Disp: , Rfl:    predniSONE (DELTASONE) 5 MG tablet, Take by mouth., Disp: , Rfl:    tiZANidine (ZANAFLEX) 2 MG tablet, Take 2 mg by mouth 3 (three) times daily as needed., Disp: , Rfl:    valACYclovir (VALTREX) 1000 MG tablet, Take 1 tablet (1,000 mg total) by mouth daily., Disp: 90 tablet, Rfl: 3  No Known Allergies   Review of Systems  Constitutional:  Positive for malaise/fatigue. Negative for chills, fever and weight loss.  HENT:  Negative for hearing loss, nosebleeds, sore throat and tinnitus.   Eyes:  Negative for blurred vision, double vision and photophobia.  Respiratory:  Negative for cough, shortness of breath and wheezing.   Cardiovascular:  Negative for chest pain, palpitations and leg swelling.  Gastrointestinal:  Negative for blood in stool, constipation, diarrhea, heartburn, nausea and vomiting.  Genitourinary:  Negative for dysuria and frequency.  Musculoskeletal:  Positive for joint pain and myalgias. Negative for falls.  Skin:  Negative for itching and rash.  Neurological:  Positive for tingling. Negative for dizziness, tremors, loss of consciousness, weakness and headaches.  Psychiatric/Behavioral:  Negative for depression and memory loss. The patient has insomnia. The patient is not nervous/anxious.       Objective  Vitals:   03/22/23 1430  BP: (!) 144/72  Pulse: (!) 101  Resp: 16  Temp: 97.9 F (36.6 C)  TempSrc: Oral  SpO2: 99%  Weight: 140 lb 11.2 oz (63.8 kg)  Height: 5\' 2"  (1.575 m)    Body mass index is 25.73 kg/m.  Physical Exam Vitals reviewed.  Constitutional:      General: She is awake.     Appearance: Normal appearance. She is well-developed and well-groomed.  HENT:     Head: Normocephalic and atraumatic.     Right Ear: Hearing, tympanic membrane, ear canal and external ear normal.     Left Ear: Hearing, tympanic membrane, ear canal and external ear normal.      Nose: Nose normal.     Mouth/Throat:     Lips: Pink.     Mouth: Mucous membranes are moist.     Pharynx: Oropharynx is clear. No oropharyngeal exudate or posterior oropharyngeal erythema.  Eyes:     General: Lids are normal. Gaze aligned appropriately.     Extraocular Movements: Extraocular movements intact.     Conjunctiva/sclera: Conjunctivae normal.     Pupils: Pupils are equal, round, and reactive to light.  Neck:     Thyroid: No thyroid mass, thyromegaly or thyroid tenderness.  Cardiovascular:     Rate and Rhythm: Normal rate and regular rhythm.     Pulses: Normal pulses.          Radial pulses are 2+ on the right side and 2+ on the left side.     Heart sounds: Normal heart sounds. No murmur heard.    No friction rub. No gallop.  Pulmonary:     Effort: Pulmonary effort is normal.     Breath sounds: Normal breath sounds. No decreased air movement. No decreased breath sounds, wheezing, rhonchi or rales.  Abdominal:     General: Abdomen is flat. Bowel sounds are normal.     Palpations: Abdomen is soft.     Tenderness: There is no abdominal tenderness.  Musculoskeletal:        General: Normal range of motion.     Cervical back: Normal range of motion and neck supple. No pain with movement.     Right lower leg: No edema.     Left lower leg: No edema.  Lymphadenopathy:     Head:     Right side of head: No submental, submandibular or preauricular adenopathy.     Left side of head: No submental, submandibular or preauricular adenopathy.     Cervical:     Right cervical: No superficial or posterior cervical adenopathy.    Left cervical: No superficial or posterior cervical adenopathy.     Upper Body:     Right upper body: No supraclavicular adenopathy.     Left upper body: No supraclavicular adenopathy.  Skin:    General: Skin is warm and dry.     Capillary Refill: Capillary refill takes less than 2 seconds.  Neurological:     General: No focal deficit present.      Mental Status: She is alert and oriented to person, place, and time.     GCS: GCS eye subscore is 4. GCS verbal subscore is 5. GCS motor subscore is 6.     Cranial Nerves: No cranial nerve deficit, dysarthria or facial asymmetry.     Motor: Weakness present. No tremor, atrophy or abnormal muscle tone.     Gait: Gait is intact.     Deep Tendon Reflexes:     Reflex Scores:  Patellar reflexes are 2+ on the right side and 2+ on the left side.    Comments: Bilateral grip strength is reduced to 3/5   Psychiatric:        Attention and Perception: Attention and perception normal.        Mood and Affect: Mood and affect normal.        Speech: Speech normal.        Behavior: Behavior normal. Behavior is cooperative.        Thought Content: Thought content normal.        Cognition and Memory: Cognition and memory normal.        Judgment: Judgment normal.      No results found for this or any previous visit (from the past 2160 hour(s)).   Fall Risk:    03/22/2023    2:30 PM 03/09/2023    8:13 AM 03/19/2022    1:46 PM 02/16/2022    1:06 PM 11/09/2021    8:24 AM  Fall Risk   Falls in the past year? 0 0 0 0 0  Number falls in past yr: 0 0 0 0 0  Injury with Fall? 0 0 0 0 0  Risk for fall due to : No Fall Risks  No Fall Risks No Fall Risks No Fall Risks  Follow up Falls prevention discussed;Education provided;Falls evaluation completed  Falls prevention discussed;Education provided;Falls evaluation completed Falls prevention discussed;Education provided Falls prevention discussed;Education provided     Functional Status Survey: Is the patient deaf or have difficulty hearing?: No Does the patient have difficulty seeing, even when wearing glasses/contacts?: No Does the patient have difficulty concentrating, remembering, or making decisions?: No Does the patient have difficulty walking or climbing stairs?: No Does the patient have difficulty dressing or bathing?: No Does the patient have  difficulty doing errands alone such as visiting a doctor's office or shopping?: No   Assessment & Plan  Problem List Items Addressed This Visit       Other   Vitamin D deficiency   Relevant Orders   Vitamin D (25 hydroxy)   Hyperlipidemia   Relevant Orders   Lipid panel   Other Visit Diagnoses     Annual physical exam    -  Primary   Relevant Orders   TSH   Hemoglobin A1c   Lipid panel   CBC with Differential/Platelet   COMPLETE METABOLIC PANEL WITH GFR       -USPSTF grade A and B recommendations reviewed with patient; age-appropriate recommendations, preventive care, screening tests, etc discussed and encouraged; healthy living encouraged; see AVS for patient education given to patient -Discussed importance of 150 minutes of physical activity weekly, eat two servings of fish weekly, eat one serving of tree nuts ( cashews, pistachios, pecans, almonds.Marland Kitchen) every other day, eat 6 servings of fruit/vegetables daily and drink plenty of water and avoid sweet beverages.   -Reviewed Health Maintenance: Yes.    Return in about 4 weeks (around 04/19/2023) for postmenopausal symptoms, weakness/muscle aches.   I, George Haggart E Tamitha Norell, PA-C, have reviewed all documentation for this visit. The documentation on 03/22/23 for the exam, diagnosis, procedures, and orders are all accurate and complete.   Jacquelin Hawking, MHS, PA-C [facility]

## 2023-03-23 LAB — COMPLETE METABOLIC PANEL WITH GFR
AG Ratio: 1.4 (calc) (ref 1.0–2.5)
ALT: 18 U/L (ref 6–29)
AST: 20 U/L (ref 10–35)
Albumin: 4.2 g/dL (ref 3.6–5.1)
Alkaline phosphatase (APISO): 84 U/L (ref 37–153)
BUN: 14 mg/dL (ref 7–25)
CO2: 29 mmol/L (ref 20–32)
Calcium: 9.7 mg/dL (ref 8.6–10.4)
Chloride: 102 mmol/L (ref 98–110)
Creat: 0.6 mg/dL (ref 0.50–1.03)
Globulin: 3 g/dL (ref 1.9–3.7)
Glucose, Bld: 90 mg/dL (ref 65–99)
Potassium: 4.5 mmol/L (ref 3.5–5.3)
Sodium: 140 mmol/L (ref 135–146)
Total Bilirubin: 0.5 mg/dL (ref 0.2–1.2)
Total Protein: 7.2 g/dL (ref 6.1–8.1)
eGFR: 106 mL/min/{1.73_m2} (ref >=60)

## 2023-03-23 LAB — LIPID PANEL
Cholesterol: 238 mg/dL — ABNORMAL HIGH (ref <200)
HDL: 94 mg/dL (ref >=50)
LDL Cholesterol (Calc): 126 mg/dL — ABNORMAL HIGH
Non-HDL Cholesterol (Calc): 144 mg/dL — ABNORMAL HIGH (ref <130)
Total CHOL/HDL Ratio: 2.5 (calc) (ref <5.0)
Triglycerides: 81 mg/dL (ref <150)

## 2023-03-23 LAB — CBC WITH DIFFERENTIAL/PLATELET
Absolute Lymphocytes: 3135 {cells}/uL (ref 850–3900)
Absolute Monocytes: 631 {cells}/uL (ref 200–950)
Basophils Absolute: 64 {cells}/uL (ref 0–200)
Basophils Relative: 0.6 %
Eosinophils Absolute: 43 {cells}/uL (ref 15–500)
Eosinophils Relative: 0.4 %
HCT: 42.1 % (ref 35.0–45.0)
Hemoglobin: 14.1 g/dL (ref 11.7–15.5)
MCH: 30.5 pg (ref 27.0–33.0)
MCHC: 33.5 g/dL (ref 32.0–36.0)
MCV: 90.9 fL (ref 80.0–100.0)
MPV: 10.2 fL (ref 7.5–12.5)
Monocytes Relative: 5.9 %
Neutro Abs: 6827 {cells}/uL (ref 1500–7800)
Neutrophils Relative %: 63.8 %
Platelets: 340 10*3/uL (ref 140–400)
RBC: 4.63 10*6/uL (ref 3.80–5.10)
RDW: 12.7 % (ref 11.0–15.0)
Total Lymphocyte: 29.3 %
WBC: 10.7 10*3/uL (ref 3.8–10.8)

## 2023-03-23 LAB — TSH: TSH: 0.89 m[IU]/L

## 2023-03-23 LAB — HEMOGLOBIN A1C
Hgb A1c MFr Bld: 5.7 %{Hb} — ABNORMAL HIGH (ref <5.7)
Mean Plasma Glucose: 117 mg/dL
eAG (mmol/L): 6.5 mmol/L

## 2023-03-23 LAB — VITAMIN D 25 HYDROXY (VIT D DEFICIENCY, FRACTURES): Vit D, 25-Hydroxy: 25 ng/mL — ABNORMAL LOW (ref 30–100)

## 2023-03-25 MED ORDER — VITAMIN D (ERGOCALCIFEROL) 1.25 MG (50000 UNIT) PO CAPS: 50000.0000 [IU] | ORAL_CAPSULE | ORAL | 0 refills | Status: AC

## 2023-03-25 NOTE — Addendum Note (Signed)
Addended by: Jacquelin Hawking on: 03/25/2023 03:17 PM   Modules accepted: Orders

## 2023-03-25 NOTE — Progress Notes (Signed)
`   Your labs are back Your A1c was 5.7% which is in the prediabetic range. No medications are indicated at this time but I do recommend reducing your sugar and carb intake and making sure you are exercising regularly Your cholesterol is elevated and appears to be increased from previous labs.  I recommend a trial of diet and exercise to see if we can control this without starting medication.  This would include reducing saturated fats in your daily diet and increasing "good" fats such as those from olive oil, tree nuts, avocado, etc.  I also recommend increasing your weekly exercise to include at least 150 minutes of moderate intensity physical activity.  If these measures do not improve your cholesterol over the next 6 months I recommend starting a medication to manage your cholesterol Your vitamin D is low.  I have sent in a prescription for a vitamin D supplement for you to take once per week.  Please take as directed for the next 12 weeks and we will recheck in 3 months  Your thyroid testing was normal Your CBC was overall normal, no signs of anemia Your electrolytes, liver and kidney functions are overall in normal ranges Please let us know if you have further questions or concerns

## 2023-06-01 ENCOUNTER — Ambulatory Visit
Admission: RE | Admit: 2023-06-01 | Discharge: 2023-06-01 | Disposition: A | Discharge status: Home / Self Care | Payer: 59 | Source: Ambulatory Visit | Attending: Family Medicine | Admitting: Family Medicine

## 2023-06-01 ENCOUNTER — Ambulatory Visit
Admission: RE | Admit: 2023-06-01 | Discharge: 2023-06-01 | Disposition: A | Discharge status: Home / Self Care | Payer: 59 | Source: Ambulatory Visit | Attending: Internal Medicine | Admitting: Internal Medicine

## 2023-06-01 DIAGNOSIS — Z1231 Encounter for screening mammogram for malignant neoplasm of breast: Secondary | ICD-10-CM

## 2023-06-01 DIAGNOSIS — Z13820 Encounter for screening for osteoporosis: Secondary | ICD-10-CM | POA: Diagnosis present

## 2023-06-01 DIAGNOSIS — Z0000 Encounter for general adult medical examination without abnormal findings: Secondary | ICD-10-CM
